# Patient Record
Sex: Male | Born: 1965 | Race: White | Hispanic: No | Marital: Married | Smoking: Never smoker
Health system: Southern US, Community
[De-identification: ages and names within clinical notes are randomized; demographics above are authoritative.]

## PROBLEM LIST (undated history)

## (undated) DIAGNOSIS — R412 Retrograde amnesia: Secondary | ICD-10-CM

## (undated) HISTORY — PX: KNEE ARTHROSCOPY: SHX127

## (undated) HISTORY — PX: ANKLE ARTHROSCOPY: SHX545

## (undated) HISTORY — PX: TESTICLE SURGERY: SHX794

---

## 1999-03-03 ENCOUNTER — Emergency Department (HOSPITAL_COMMUNITY): Admission: EM | Admit: 1999-03-03 | Discharge: 1999-03-04 | Payer: Self-pay

## 2010-11-01 ENCOUNTER — Emergency Department (HOSPITAL_BASED_OUTPATIENT_CLINIC_OR_DEPARTMENT_OTHER)
Admission: EM | Admit: 2010-11-01 | Discharge: 2010-11-01 | Disposition: A | Discharge status: Home / Self Care | Payer: Self-pay | Attending: Emergency Medicine | Admitting: Emergency Medicine

## 2010-11-01 ENCOUNTER — Emergency Department (INDEPENDENT_AMBULATORY_CARE_PROVIDER_SITE_OTHER): Payer: Self-pay

## 2010-11-01 DIAGNOSIS — R42 Dizziness and giddiness: Secondary | ICD-10-CM | POA: Insufficient documentation

## 2010-11-01 DIAGNOSIS — H538 Other visual disturbances: Secondary | ICD-10-CM

## 2010-11-01 LAB — CBC
HCT: 42.3 % (ref 39.0–52.0)
Hemoglobin: 15.3 g/dL (ref 13.0–17.0)
MCH: 31.2 pg (ref 26.0–34.0)
MCHC: 36.2 g/dL — ABNORMAL HIGH (ref 30.0–36.0)
MCV: 86.2 fL (ref 78.0–100.0)
Platelets: 208 10*3/uL (ref 150–400)
RBC: 4.91 MIL/uL (ref 4.22–5.81)
RDW: 12.7 % (ref 11.5–15.5)
WBC: 7.6 10*3/uL (ref 4.0–10.5)

## 2010-11-01 LAB — URINALYSIS, ROUTINE W REFLEX MICROSCOPIC
Glucose, UA: NEGATIVE mg/dL
Ketones, ur: NEGATIVE mg/dL
Nitrite: NEGATIVE
Specific Gravity, Urine: 1.021 (ref 1.005–1.030)
pH: 6 (ref 5.0–8.0)

## 2010-11-01 LAB — COMPREHENSIVE METABOLIC PANEL
ALT: 38 U/L (ref 0–53)
AST: 29 U/L (ref 0–37)
Albumin: 3.9 g/dL (ref 3.5–5.2)
Alkaline Phosphatase: 86 U/L (ref 39–117)
BUN: 18 mg/dL (ref 6–23)
CO2: 23 mEq/L (ref 19–32)
Calcium: 8.8 mg/dL (ref 8.4–10.5)
Chloride: 106 mEq/L (ref 96–112)
Creatinine, Ser: 1.1 mg/dL (ref 0.4–1.5)
GFR calc Af Amer: >60 mL/min (ref >60)
GFR calc non Af Amer: >60 mL/min (ref >60)
Glucose, Bld: 135 mg/dL — ABNORMAL HIGH (ref 70–99)
Potassium: 3.9 mEq/L (ref 3.5–5.1)
Sodium: 141 mEq/L (ref 135–145)
Total Bilirubin: 0.6 mg/dL (ref 0.3–1.2)
Total Protein: 7.4 g/dL (ref 6.0–8.3)

## 2010-11-01 LAB — POCT CARDIAC MARKERS
CKMB, poc: <1 ng/mL — ABNORMAL LOW (ref 1.0–8.0)
CKMB, poc: <1 ng/mL — ABNORMAL LOW (ref 1.0–8.0)
Myoglobin, poc: 44.4 ng/mL (ref 12–200)
Troponin i, poc: <0.05 ng/mL (ref 0.00–0.09)
Troponin i, poc: <0.05 ng/mL (ref 0.00–0.09)

## 2010-11-01 LAB — DIFFERENTIAL
Basophils Absolute: 0 10*3/uL (ref 0.0–0.1)
Basophils Relative: 0 % (ref 0–1)
Eosinophils Absolute: 0.1 10*3/uL (ref 0.0–0.7)
Eosinophils Relative: 2 % (ref 0–5)
Lymphocytes Relative: 26 % (ref 12–46)
Lymphs Abs: 2 10*3/uL (ref 0.7–4.0)
Monocytes Absolute: 0.8 10*3/uL (ref 0.1–1.0)
Monocytes Relative: 10 % (ref 3–12)
Neutro Abs: 4.7 10*3/uL (ref 1.7–7.7)
Neutrophils Relative %: 62 % (ref 43–77)

## 2011-01-26 ENCOUNTER — Emergency Department (HOSPITAL_BASED_OUTPATIENT_CLINIC_OR_DEPARTMENT_OTHER)
Admission: EM | Admit: 2011-01-26 | Discharge: 2011-01-26 | Disposition: A | Discharge status: Home / Self Care | Payer: Self-pay | Attending: Emergency Medicine | Admitting: Emergency Medicine

## 2011-01-26 DIAGNOSIS — R109 Unspecified abdominal pain: Secondary | ICD-10-CM | POA: Insufficient documentation

## 2011-01-26 DIAGNOSIS — N200 Calculus of kidney: Secondary | ICD-10-CM | POA: Insufficient documentation

## 2011-01-26 LAB — URINE MICROSCOPIC-ADD ON

## 2011-01-26 LAB — URINALYSIS, ROUTINE W REFLEX MICROSCOPIC
Bilirubin Urine: NEGATIVE
Ketones, ur: 15 mg/dL — AB
Nitrite: NEGATIVE
Specific Gravity, Urine: 1.031 — ABNORMAL HIGH (ref 1.005–1.030)
Urobilinogen, UA: 0.2 mg/dL (ref 0.0–1.0)
pH: 5.5 (ref 5.0–8.0)

## 2011-01-26 LAB — BASIC METABOLIC PANEL
Calcium: 9.6 mg/dL (ref 8.4–10.5)
Creatinine, Ser: 1.4 mg/dL — ABNORMAL HIGH (ref 0.50–1.35)
GFR calc Af Amer: >60 mL/min (ref >60)
Sodium: 140 mEq/L (ref 135–145)

## 2012-06-19 IMAGING — CT CT HEAD W/O CM
1 series · 16 of 30 positions shown, 20 images · non-contrast
Comparison: None.

CLINICAL DATA: Dizzy with blurred vision.

CT HEAD WITHOUT CONTRAST
TECHNIQUE: Contiguous axial images were obtained from the base of
the skull through the vertex without contrast.

[Series 2: head 4.8 h37s · axial · 0.45mm/px · z∈[-150,+10]mm · 16 of 36 slices shown, 20 images]
[im 2/36  brain]
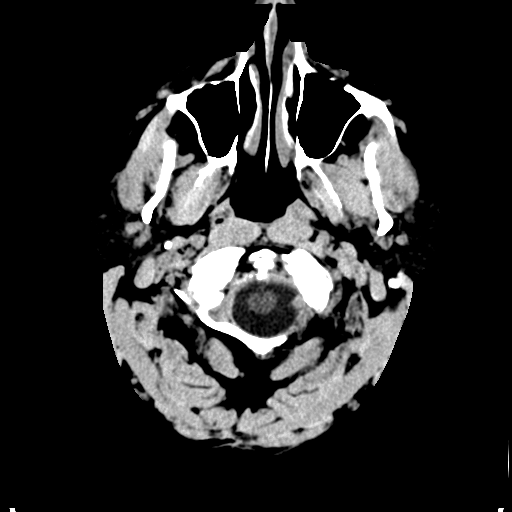
[im 2/36  bone]
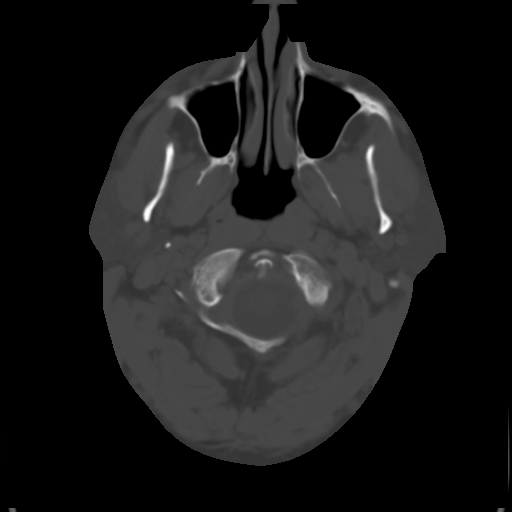
[im 4/36  brain]
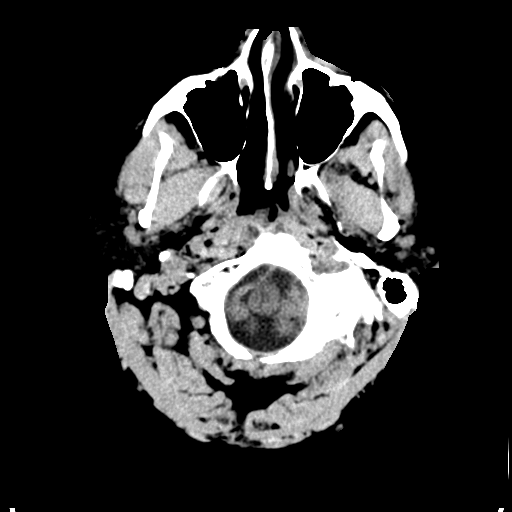
[im 7/36  brain]
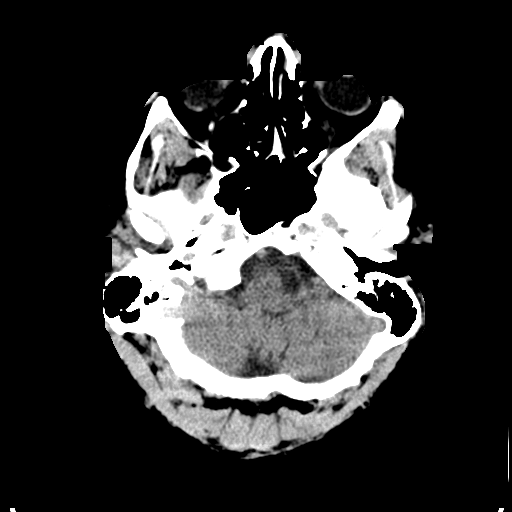
[im 9/36  brain]
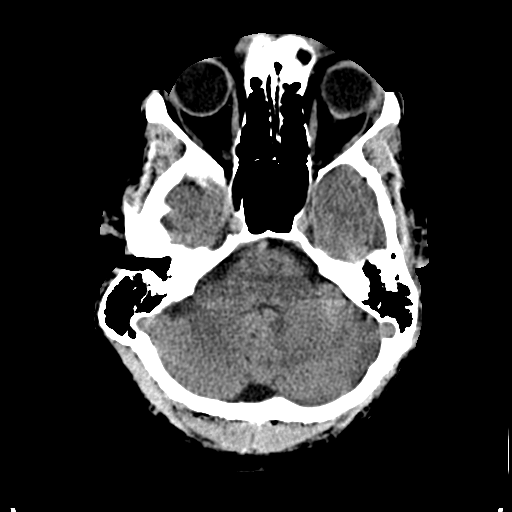
[im 10/36  brain]
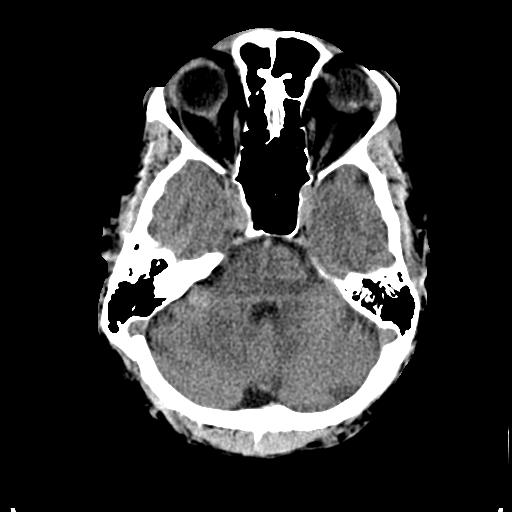
[im 10/36  bone]
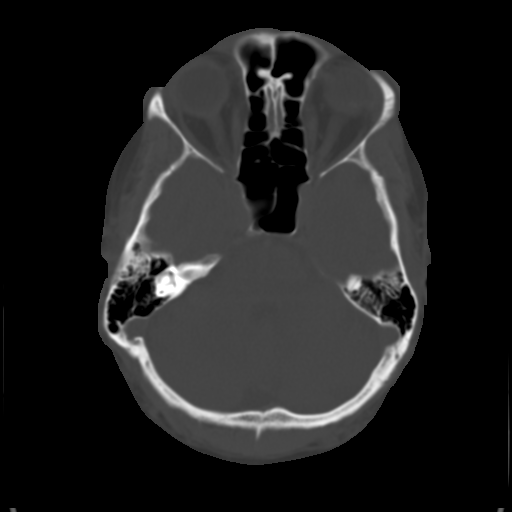
[im 13/36  brain]
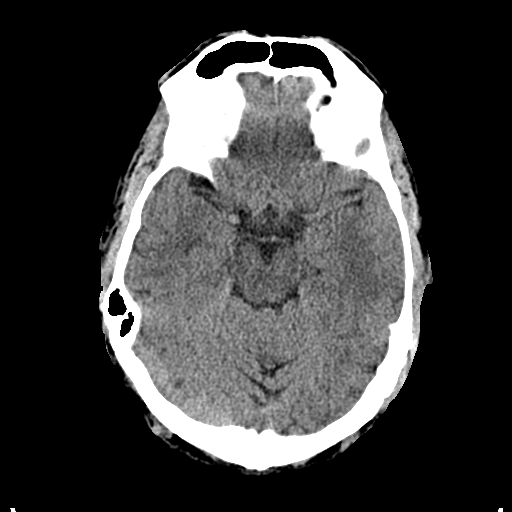
[im 15/36  brain]
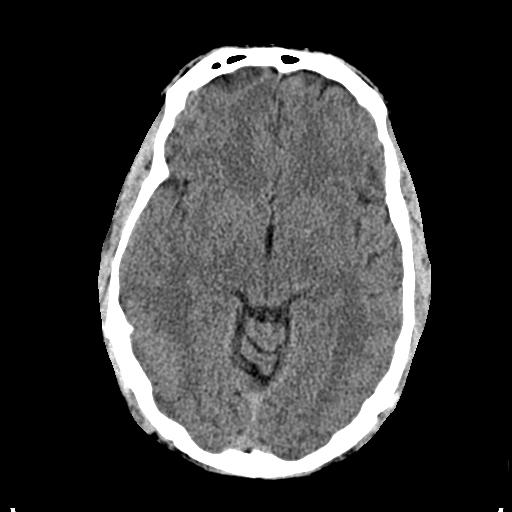
[im 17/36  brain]
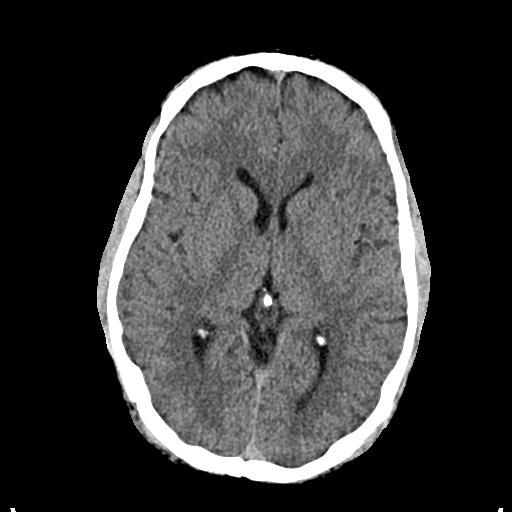
[im 19/36  brain]
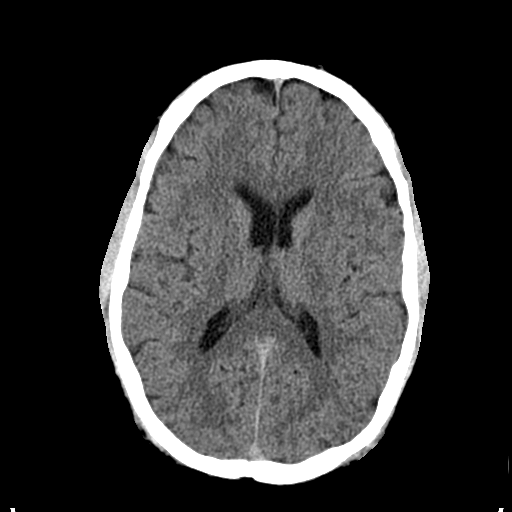
[im 19/36  bone]
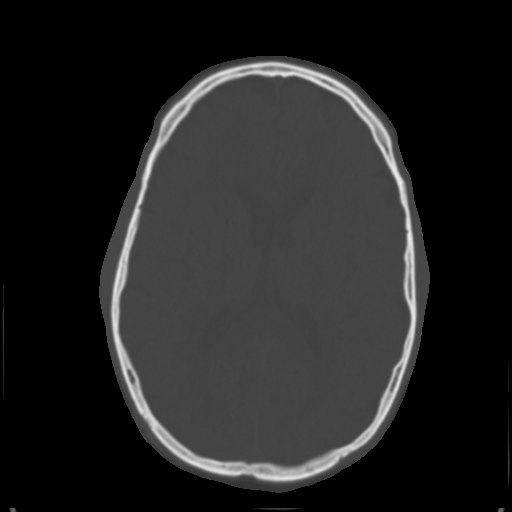
[im 21/36  brain]
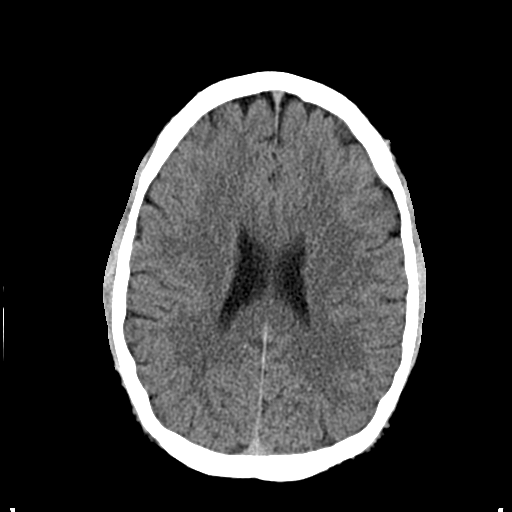
[im 23/36  brain]
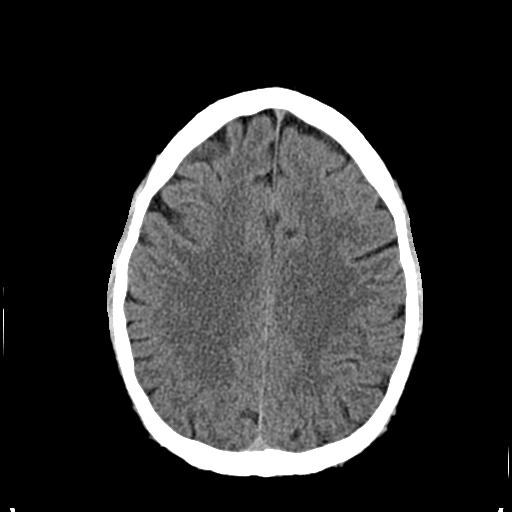
[im 26/36  brain]
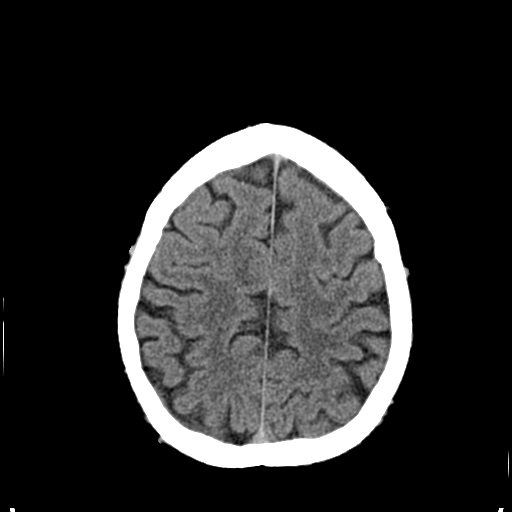
[im 27/36  brain]
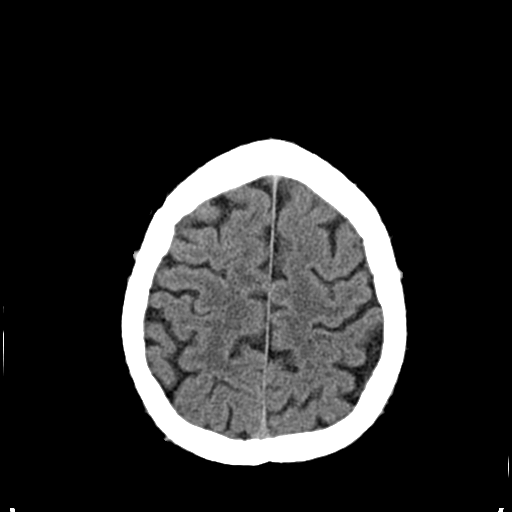
[im 27/36  bone]
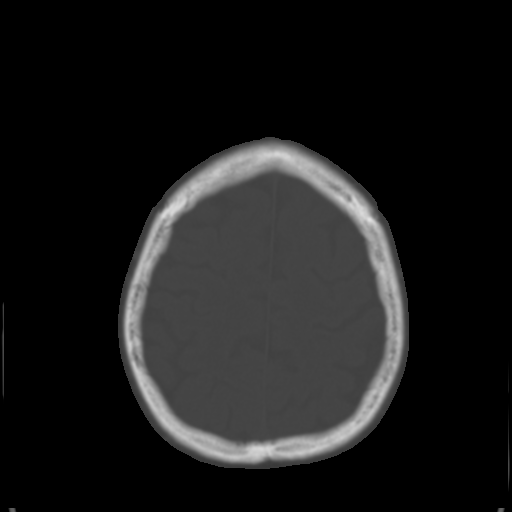
[im 29/36  brain]
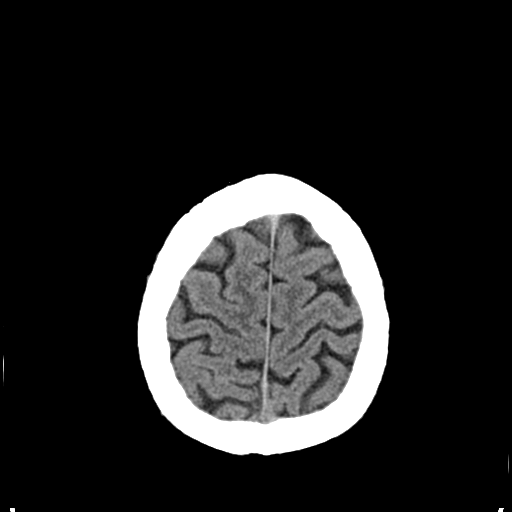
[im 32/36  brain]
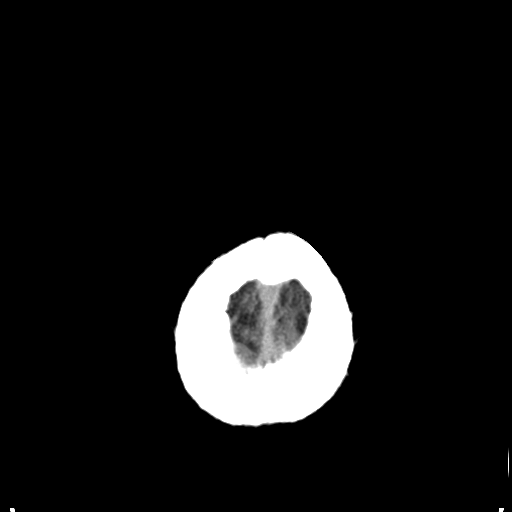
[im 34/36  brain]
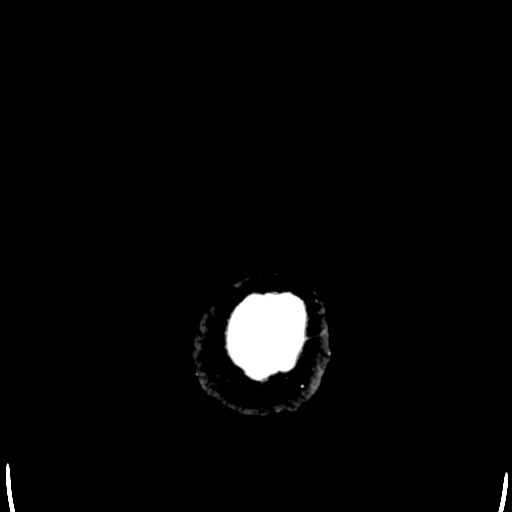

[16 of 30 positions shown; findings below may reference images not displayed]

FINDINGS: There is no evidence for acute infarction, intracranial
hemorrhage, mass lesion, hydrocephalus, or extra-axial fluid. There
is no atrophy or white matter disease.  The calvarium is intact.
There is no acute sinus or mastoid disease. No visible orbital
findings.
IMPRESSION: Negative exam.

## 2015-06-09 ENCOUNTER — Inpatient Hospital Stay (HOSPITAL_COMMUNITY)
Admission: EM | Admit: 2015-06-09 | Discharge: 2015-06-10 | DRG: 312 | Disposition: A | Discharge status: Home / Self Care | Payer: Self-pay | Attending: Family Medicine | Admitting: Family Medicine

## 2015-06-09 ENCOUNTER — Encounter (HOSPITAL_COMMUNITY): Payer: Self-pay | Admitting: Emergency Medicine

## 2015-06-09 ENCOUNTER — Emergency Department (HOSPITAL_COMMUNITY): Payer: Self-pay

## 2015-06-09 ENCOUNTER — Emergency Department (HOSPITAL_COMMUNITY): Payer: MEDICAID

## 2015-06-09 DIAGNOSIS — Z8249 Family history of ischemic heart disease and other diseases of the circulatory system: Secondary | ICD-10-CM

## 2015-06-09 DIAGNOSIS — Y92000 Kitchen of unspecified non-institutional (private) residence as  the place of occurrence of the external cause: Secondary | ICD-10-CM

## 2015-06-09 DIAGNOSIS — R4020 Unspecified coma: Secondary | ICD-10-CM | POA: Diagnosis present

## 2015-06-09 DIAGNOSIS — R251 Tremor, unspecified: Secondary | ICD-10-CM | POA: Diagnosis present

## 2015-06-09 DIAGNOSIS — M25511 Pain in right shoulder: Secondary | ICD-10-CM | POA: Diagnosis present

## 2015-06-09 DIAGNOSIS — E860 Dehydration: Secondary | ICD-10-CM | POA: Diagnosis present

## 2015-06-09 DIAGNOSIS — R412 Retrograde amnesia: Secondary | ICD-10-CM | POA: Diagnosis present

## 2015-06-09 DIAGNOSIS — R531 Weakness: Secondary | ICD-10-CM | POA: Diagnosis present

## 2015-06-09 DIAGNOSIS — R4781 Slurred speech: Secondary | ICD-10-CM

## 2015-06-09 DIAGNOSIS — R55 Syncope and collapse: Principal | ICD-10-CM | POA: Diagnosis present

## 2015-06-09 DIAGNOSIS — W19XXXA Unspecified fall, initial encounter: Secondary | ICD-10-CM | POA: Diagnosis present

## 2015-06-09 DIAGNOSIS — R471 Dysarthria and anarthria: Secondary | ICD-10-CM | POA: Diagnosis present

## 2015-06-09 HISTORY — DX: Retrograde amnesia: R41.2

## 2015-06-09 LAB — CBC
HCT: 45 % (ref 39.0–52.0)
Hemoglobin: 15.9 g/dL (ref 13.0–17.0)
MCH: 31.9 pg (ref 26.0–34.0)
MCHC: 35.3 g/dL (ref 30.0–36.0)
MCV: 90.4 fL (ref 78.0–100.0)
PLATELETS: 183 10*3/uL (ref 150–400)
RBC: 4.98 MIL/uL (ref 4.22–5.81)
RDW: 13.2 % (ref 11.5–15.5)
WBC: 7.4 10*3/uL (ref 4.0–10.5)

## 2015-06-09 LAB — COMPREHENSIVE METABOLIC PANEL
ALBUMIN: 3.8 g/dL (ref 3.5–5.0)
ALT: 35 U/L (ref 17–63)
AST: 33 U/L (ref 15–41)
Alkaline Phosphatase: 63 U/L (ref 38–126)
Anion gap: 10 (ref 5–15)
BUN: 18 mg/dL (ref 6–20)
CALCIUM: 9.6 mg/dL (ref 8.9–10.3)
CHLORIDE: 103 mmol/L (ref 101–111)
CO2: 26 mmol/L (ref 22–32)
Creatinine, Ser: 1.51 mg/dL — ABNORMAL HIGH (ref 0.61–1.24)
GFR calc non Af Amer: 52 mL/min — ABNORMAL LOW (ref >60)
Glucose, Bld: 114 mg/dL — ABNORMAL HIGH (ref 65–99)
Potassium: 4.7 mmol/L (ref 3.5–5.1)
SODIUM: 139 mmol/L (ref 135–145)
TOTAL PROTEIN: 7 g/dL (ref 6.5–8.1)
Total Bilirubin: 0.8 mg/dL (ref 0.3–1.2)

## 2015-06-09 LAB — I-STAT TROPONIN, ED: Troponin i, poc: 0.01 ng/mL (ref 0.00–0.08)

## 2015-06-09 LAB — DIFFERENTIAL
BASOS ABS: 0 10*3/uL (ref 0.0–0.1)
BASOS PCT: 0 %
Eosinophils Absolute: 0 10*3/uL (ref 0.0–0.7)
Eosinophils Relative: 1 %
LYMPHS PCT: 15 %
Lymphs Abs: 1.1 10*3/uL (ref 0.7–4.0)
Monocytes Absolute: 0.8 10*3/uL (ref 0.1–1.0)
Monocytes Relative: 11 %
NEUTROS ABS: 5.5 10*3/uL (ref 1.7–7.7)
NEUTROS PCT: 73 %

## 2015-06-09 LAB — PROTIME-INR
INR: 1.03 (ref 0.00–1.49)
INR: 1.08 (ref 0.00–1.49)
PROTHROMBIN TIME: 13.7 s (ref 11.6–15.2)
Prothrombin Time: 14.2 seconds (ref 11.6–15.2)

## 2015-06-09 LAB — I-STAT CHEM 8, ED
BUN: 20 mg/dL (ref 6–20)
CALCIUM ION: 1.17 mmol/L (ref 1.12–1.23)
Chloride: 102 mmol/L (ref 101–111)
Creatinine, Ser: 1.5 mg/dL — ABNORMAL HIGH (ref 0.61–1.24)
GLUCOSE: 112 mg/dL — AB (ref 65–99)
HCT: 49 % (ref 39.0–52.0)
HEMOGLOBIN: 16.7 g/dL (ref 13.0–17.0)
Potassium: 4.5 mmol/L (ref 3.5–5.1)
Sodium: 139 mmol/L (ref 135–145)
TCO2: 24 mmol/L (ref 0–100)

## 2015-06-09 LAB — LIPID PANEL
CHOL/HDL RATIO: 3.1 ratio
Cholesterol: 159 mg/dL (ref 0–200)
HDL: 52 mg/dL (ref >40)
LDL CALC: 100 mg/dL — AB (ref 0–99)
TRIGLYCERIDES: 37 mg/dL (ref <150)
VLDL: 7 mg/dL (ref 0–40)

## 2015-06-09 LAB — CBG MONITORING, ED: GLUCOSE-CAPILLARY: 93 mg/dL (ref 65–99)

## 2015-06-09 LAB — RAPID URINE DRUG SCREEN, HOSP PERFORMED
Amphetamines: NOT DETECTED
BARBITURATES: NOT DETECTED
Benzodiazepines: NOT DETECTED
Cocaine: NOT DETECTED
Opiates: NOT DETECTED
TETRAHYDROCANNABINOL: NOT DETECTED

## 2015-06-09 LAB — TSH: TSH: 1.588 u[IU]/mL (ref 0.350–4.500)

## 2015-06-09 LAB — CK: Total CK: 176 U/L (ref 49–397)

## 2015-06-09 LAB — MRSA PCR SCREENING: MRSA BY PCR: NEGATIVE

## 2015-06-09 LAB — TROPONIN I: Troponin I: <0.03 ng/mL (ref <0.031)

## 2015-06-09 LAB — APTT: APTT: 25 s (ref 24–37)

## 2015-06-09 MED ORDER — SODIUM CHLORIDE 0.9 % IJ SOLN
3.0000 mL | Freq: Two times a day (BID) | INTRAMUSCULAR | Status: DC
  Administered 2015-06-10: 3 mL via INTRAVENOUS

## 2015-06-09 MED ORDER — SODIUM CHLORIDE 0.9 % IV BOLUS (SEPSIS)
1000.0000 mL | Freq: Once | INTRAVENOUS | Status: AC
Start: 2015-06-09 — End: 2015-06-09
  Administered 2015-06-09: 1000 mL via INTRAVENOUS

## 2015-06-09 MED ORDER — ACETAMINOPHEN 325 MG PO TABS: 650.0000 mg | ORAL_TABLET | Freq: Four times a day (QID) | ORAL | Status: DC | PRN

## 2015-06-09 MED ORDER — ASPIRIN EC 325 MG PO TBEC: 325.0000 mg | DELAYED_RELEASE_TABLET | Freq: Every day | ORAL | Status: DC

## 2015-06-09 MED ORDER — SODIUM CHLORIDE 0.9 % IV SOLN
INTRAVENOUS | Status: DC
  Administered 2015-06-09: 19:00:00 via INTRAVENOUS

## 2015-06-09 MED ORDER — ENOXAPARIN SODIUM 40 MG/0.4ML ~~LOC~~ SOLN
40.0000 mg | SUBCUTANEOUS | Status: DC
  Administered 2015-06-09: 40 mg via SUBCUTANEOUS
  Filled 2015-06-09 (×2): qty 0.4

## 2015-06-09 MED ORDER — IOHEXOL 350 MG/ML SOLN
130.0000 mL | Freq: Once | INTRAVENOUS | Status: AC | PRN
  Administered 2015-06-09: 130 mL via INTRAVENOUS

## 2015-06-09 NOTE — H&P (Signed)
Family Medicine Teaching [facility] Admission History and Physical Service Pager: [phone]  Patient name: Scott Conway Medical record number: [id number] Date of birth: 1965-12-21 Age: 49 y.o. Gender: male  Primary Care Provider: No primary care provider on file. Consultants: neuorology Code Status: full  Chief Complaint: Fall and Loss of Consciousness  Assessment and Plan: Scott Conway is a 49 y.o. male presenting with presenting with fall, LOC and seizure like activity. He has no PMH  Fall/LOC/Shaking: likely seizure activity. He appears exhausted and fatigued which is suggestive for post-ictal state. However, there is no sign of tongue bite or incontinence. Unlikely to be syncopal episode with prolonged loss of consciousness and post-syncopal signs. Unlikely to be CVA given no focal neurologic finding and negative CT and CTA of his head and neck. He has some T wave changes on EKG but POCT troponin is negative. Follow up troponin also negative. He denies chest pain or shortness of breath as well. HEART score of 2. Mother died of sudden heart attack without prior diagnosis although at age 61 . Point of care glucose 93 which makes hypoglycemia unlikely. CMP within normal limit to suspect electrolyte imbalance. Infectious etiologies unlikely without fever and leukocytosis. UDS negative. - Admit to step down with telemetry. Attending Dr. Perley Jain - Neuro consulted in ED. Appreciate recs. Follow up further recs - CK - EEG - MRI brain with and without contrast - Repeat EKG. Initial EKG with some TW changes. - Trend troponin. POCT troponin negative - Echo - Risk stratification labs for CVA although this is unlikely at this time(A1c, Lipid panel, TSH) - Consider antiplatelet if stroke likely but I am less suspicious at this point.  Elevated Cr to 1.51. Similar to prior of 1.40 in 2012. No history of CKD. This could be from muscular mass.  - Follow BMP - NS @ 159ml/hr.   Right shoulder  pain: chronic. X-ray negative for fracture or dislocation but significant for mild degenerative changes -Tylenol as needed pain  FEN/GI: -IVF as above -regular diet (passed SLP in ED)  Prophylaxis: Lovenex  Disposition: step down for q2h neuro check. Anticipate transfer to telemetry tomorrow.  History of Present Illness:  Scott Conway is a 49 y.o. male presenting with fall and LOC at home.  Patient recalls getting in the car to come home from gym this morning about 11 am. Fiance, who is an EMT reports that their son saw him going to a kitchen table and then falling on the floor and getting up.  Son thought he was joking around. Then patient headed to bathroom. Fiance heard something and thought it was her son; son later came and told her patient was laying in the bathroom unconscious, with clothes on. She shook to arouse him and he then began staring though he was not verbal. He was able to nod and shake his head to communicate. He was also drooling, arms and body shaking "like a seizure". He could hold up arms for short time but was shaking really bad. His fiance also noted rapid pulse although heart rate not measured.There was no incontinence noted. He remembers waking up in ambulance. He reports drinking a lot of water at the gym today.   Patient denies local weakness, vision changes, headache but feels weak all over. States he does note a pressure in his head when seated. He feels pain all over like he had been in car wreck. He reports his speech is back to normal but fiance states still slower than normal.  He has right shoulder pain (chronic) and took ibuprofen yesterday. He denies fever, dizziness, nausea, vomiting, chest pain, shortness of breath or illness leading to this. He never had an event like this before.  Family history significant for HTN (dad), mother with sudden cardiac death at ~55 years of age, likely from CHF.  No history of stroke or seizure. He has two siblings, who are  pretty healthy. He takes pre-workout energy powder that he get from [facility]. Denies other supplements or recreational drug use. Denies smoking. He drinks EtOH occasionally.  In ED: CT head and neck and CTA head and neck are negative. EKG with some TW changes in inferior and lateral. CMP, CBC with diff and UDS negative. Seen by neurologist who recommended admission for further evaluation. Received 1L of NS.  Review Of Systems: Per HPI  Otherwise the remainder of the systems were negative.  Patient Active Problem List   Diagnosis Date Noted  . Syncope 06/09/2015    Past Medical History: History reviewed. No pertinent past medical history.  Past Surgical History: History reviewed. No pertinent past surgical history.  Social History: Social History  Substance Use Topics  . Smoking status: Never Smoker   . Smokeless tobacco: None  . Alcohol Use: Yes   Additional social history: Please also refer to relevant sections of EMR.  Family History: HTN (dad), mother with sudden cardiac death at ~26 years of age, likley from CHF.  No history of stroke or seizure. He has two siblings, who are pretty healthy.   Allergies and Medications: No Known Allergies No current facility-administered medications on file prior to encounter.   No current outpatient prescriptions on file prior to encounter.    Objective: BP 122/82 mmHg  Pulse 70  Temp(Src) 98.2 F (36.8 C) (Oral)  Resp 21  Ht  (1.803 m)  Wt 270 lb (122.471 kg)  BMI 37.67 kg/m2  SpO2 96% Exam: Gen: tired-appearing, 49yo male in no apparent distress Eyes: PERRLA, sclera anicteric Nares: clear, no erythema, swelling or congestion Oropharynx: clear, moist CV: RRR. S1 & S2 audible, no murmurs. Resp: no apparent WOB, CTAB. Abd: +BS. Soft, NDNT, no rebound or guarding.  Ext: No edema or gross deformities. Skin: abrasions noted on legs, no signs of head trauma Neuro: MS - Awake, alert, interactive.  Speech is fluent, with no  aphasia. Attention is appropriate. No confusion. Appropriate behavior and follow commands.  Cranial Nerves - Pupils were equal and reactive (5 to 2mm); EOM normal, no nystagmus; no ptosis,  intact facial sensation, face symmetric with full strength of facial muscles, palate elevation is symmetric, tongue protrusion is symmetric with full movement to both side. Sternocleidomastoid and trapezius are with normal strength.  Tone - Normal.  Strength - normal in all muscle group Plantar reflex: down going Sensation: Intact to light touch.  No pronator drift. Coordination : No dysmetria on finger to nose.   Labs and Imaging: - CK 176 - Troponin negative - TSH 1.588 - UDS negative - Lipid Panel: Cholesterol 159, TG 37, HDL 52, LDL 100 CBC BMET   Recent Labs Lab 06/09/15 1130 06/09/15 1141  WBC 7.4  --   HGB 15.9 16.7  HCT 45.0 49.0  PLT 183  --     Recent Labs Lab 06/09/15 1130 06/09/15 1141  NA 139 139  K 4.7 4.5  CL 103 102  CO2 26  --   BUN 18 20  CREATININE 1.51* 1.50*  GLUCOSE 114* 112*  CALCIUM 9.6  --  Ct Angio Head W/cm &/or Wo Cm  06/09/2015  CLINICAL DATA:  Altered mental status. Stroke-like symptoms. Uncontrolled tremor of all extremities. Difficulty speaking. Patient fell or had syncopal episode. EXAM: CT ANGIOGRAPHY HEAD AND NECK TECHNIQUE: Multidetector CT imaging of the head and neck was performed using the standard protocol during bolus administration of intravenous contrast. Multiplanar CT image reconstructions and MIPs were obtained to evaluate the vascular anatomy. Carotid stenosis measurements (when applicable) are obtained utilizing NASCET criteria, using the distal internal carotid diameter as the denominator. CONTRAST:  130mL OMNIPAQUE IOHEXOL 350 MG/ML SOLN COMPARISON:  CT head at 11:18 earlier today. FINDINGS: CT HEAD Calvarium and skull base: No fracture or destructive lesion. Mastoids and middle ears are grossly clear. Paranasal sinuses: Imaged  portions are clear. Orbits: Negative. Brain: No evidence of acute abnormality, including acute infarct, hemorrhage, hydrocephalus, or mass lesion. CTA NECK Aortic arch: Standard branching. Imaged portion shows no evidence of aneurysm or dissection. No significant stenosis of the major arch vessel origins. Right carotid system: No evidence of dissection, stenosis (50% or greater) or occlusion. Left carotid system: No evidence of dissection, stenosis (50% or greater) or occlusion. Vertebral arteries: Both are patent, with LEFT dominant. Both contribute to basilar formation. No evidence of dissection, stenosis (50% or greater) or occlusion. Nonvascular soft tissues: Lung apices grossly clear. No osseous findings other than mild spondylosis. No neck masses. Airway midline. CTA HEAD Anterior circulation: No significant stenosis, proximal occlusion, aneurysm, or vascular malformation. Posterior circulation: No significant stenosis, proximal occlusion, aneurysm, or vascular malformation. Venous sinuses: As permitted by contrast timing, patent. Anatomic variants: None of significance. Delayed phase:   No abnormal intracranial enhancement. IMPRESSION: Negative CTA head and neck. No extracranial or intracranial vascular stenosis or occlusion of significance. No abnormal postcontrast enhancement is evident. Critical Value/emergent results were called by telephone at the time of interpretation on 06/09/2015 at 12:15 pm to Dr. Thad Rangereynolds, who verbally acknowledged these results. Electronically Signed   By: Elsie StainJohn T Curnes M.D.   On: 06/09/2015 12:51   Dg Shoulder Right  06/09/2015  CLINICAL DATA:  Fall, right shoulder pain EXAM: RIGHT SHOULDER - 2+ VIEW COMPARISON:  None. FINDINGS: No fracture or dislocation is seen. Mild degenerative changes of the glenohumeral joint. The visualized soft tissues are unremarkable. Visualized right lung is clear. IMPRESSION: No fracture or dislocation is seen. Mild degenerative changes.  Electronically Signed   By: Charline BillsSriyesh  Krishnan M.D.   On: 06/09/2015 14:48   Ct Head Wo Contrast  06/09/2015  CLINICAL DATA:  Last seen well 0830am, Pt came home from the gym and sat down at the kitchen table and then fell out into the floor. Got up to go to the bathroom and fell again. Unable to get his words out. Uncontrolled tremor of all extremities EXAM: CT HEAD WITHOUT CONTRAST CT CERVICAL SPINE WITHOUT CONTRAST TECHNIQUE: Multidetector CT imaging of the head and cervical spine was performed following the standard protocol without intravenous contrast. Multiplanar CT image reconstructions of the cervical spine were also generated. COMPARISON:  Head CT 11/01/2010 FINDINGS: CT HEAD FINDINGS Patient is scanned in nonstandard head position which generates streak artifact through the posterior fossa from the dental amalgam. No acute intracranial hemorrhage. No focal mass lesion. No CT evidence of acute infarction. No midline shift or mass effect. 2 punctate calcifications within the LEFT internal capsule (image 18, series 20 1) not changed from prior. No hydrocephalus. Basilar cisterns are patent. CT CERVICAL SPINE FINDINGS No prevertebral soft tissue swelling. Normal alignment of  cervical vertebral bodies. No loss of vertebral body height. Normal facet articulation. Normal craniocervical junction.No evidence epidural or paraspinal hematoma. IMPRESSION: 1. No acute intracranial findings. No CT evidence acute infarction. No evidence of intracranial hemorrhage. 2. No cervical spine fracture. Electronically Signed   By: Genevive Bi M.D.   On: 06/09/2015 11:47   Ct Angio Neck W/cm &/or Wo/cm  06/09/2015  CLINICAL DATA:  Altered mental status. Stroke-like symptoms. Uncontrolled tremor of all extremities. Difficulty speaking. Patient fell or had syncopal episode. EXAM: CT ANGIOGRAPHY HEAD AND NECK TECHNIQUE: Multidetector CT imaging of the head and neck was performed using the standard protocol during bolus  administration of intravenous contrast. Multiplanar CT image reconstructions and MIPs were obtained to evaluate the vascular anatomy. Carotid stenosis measurements (when applicable) are obtained utilizing NASCET criteria, using the distal internal carotid diameter as the denominator. CONTRAST:  OMNIPAQUE IOHEXOL 350 MG/ML SOLN COMPARISON:  CT head at 11:18 earlier today. FINDINGS: CT HEAD Calvarium and skull base: No fracture or destructive lesion. Mastoids and middle ears are grossly clear. Paranasal sinuses: Imaged portions are clear. Orbits: Negative. Brain: No evidence of acute abnormality, including acute infarct, hemorrhage, hydrocephalus, or mass lesion. CTA NECK Aortic arch: Standard branching. Imaged portion shows no evidence of aneurysm or dissection. No significant stenosis of the major arch vessel origins. Right carotid system: No evidence of dissection, stenosis (50% or greater) or occlusion. Left carotid system: No evidence of dissection, stenosis (50% or greater) or occlusion. Vertebral arteries: Both are patent, with LEFT dominant. Both contribute to basilar formation. No evidence of dissection, stenosis (50% or greater) or occlusion. Nonvascular soft tissues: Lung apices grossly clear. No osseous findings other than mild spondylosis. No neck masses. Airway midline. CTA HEAD Anterior circulation: No significant stenosis, proximal occlusion, aneurysm, or vascular malformation. Posterior circulation: No significant stenosis, proximal occlusion, aneurysm, or vascular malformation. Venous sinuses: As permitted by contrast timing, patent. Anatomic variants: None of significance. Delayed phase:   No abnormal intracranial enhancement. IMPRESSION: Negative CTA head and neck. No extracranial or intracranial vascular stenosis or occlusion of significance. No abnormal postcontrast enhancement is evident. Critical Value/emergent results were called by telephone at the time of interpretation on 06/09/2015  at 12:15 pm to Dr. Thad Ranger, who verbally acknowledged these results. Electronically Signed   By: Elsie Stain M.D.   On: 06/09/2015 12:51   Ct Cervical Spine Wo Contrast  06/09/2015  CLINICAL DATA:  Last seen well 0830am, Pt came home from the gym and sat down at the kitchen table and then fell out into the floor. Got up to go to the bathroom and fell again. Unable to get his words out. Uncontrolled tremor of all extremities EXAM: CT HEAD WITHOUT CONTRAST CT CERVICAL SPINE WITHOUT CONTRAST TECHNIQUE: Multidetector CT imaging of the head and cervical spine was performed following the standard protocol without intravenous contrast. Multiplanar CT image reconstructions of the cervical spine were also generated. COMPARISON:  Head CT 11/01/2010 FINDINGS: CT HEAD FINDINGS Patient is scanned in nonstandard head position which generates streak artifact through the posterior fossa from the dental amalgam. No acute intracranial hemorrhage. No focal mass lesion. No CT evidence of acute infarction. No midline shift or mass effect. 2 punctate calcifications within the LEFT internal capsule (image 18, series 20 1) not changed from prior. No hydrocephalus. Basilar cisterns are patent. CT CERVICAL SPINE FINDINGS No prevertebral soft tissue swelling. Normal alignment of cervical vertebral bodies. No loss of vertebral body height. Normal facet articulation. Normal craniocervical junction.No evidence  epidural or paraspinal hematoma. IMPRESSION: 1. No acute intracranial findings. No CT evidence acute infarction. No evidence of intracranial hemorrhage. 2. No cervical spine fracture. Electronically Signed   By: Genevive Bi M.D.   On: 06/09/2015 11:47   Ct Angio Chest Aorta W/cm &/or Wo/cm  06/09/2015  CLINICAL DATA:  Pt had syncopal episode twice today. R/o thoracic dissection No PMH Returned home from the gym and had two falls EXAM: CT ANGIOGRAPHY CHEST WITH CONTRAST TECHNIQUE: Multidetector CT imaging of the chest was  performed using the standard protocol during bolus administration of intravenous contrast. Multiplanar CT image reconstructions and MIPs were obtained to evaluate the vascular anatomy. CONTRAST:  OMNIPAQUE IOHEXOL 350 MG/ML SOLN FINDINGS: The non contrast scan shows no mediastinal hematoma, thoracic aneurysm, or hyperdense crescent. CTA via left arm IV contrast injection. Innominate vein and SVC patent. Incomplete opacification of pulmonary artery branches; the exam was not optimized for detection of pulmonary emboli. Scattered coronary calcifications. Adequate contrast opacification of the thoracic aorta with no evidence of dissection, aneurysm, or stenosis. There is classic 3-vessel brachiocephalic arch anatomy without proximal stenosis. No significant atheromatous plaque. No hilar or mediastinal adenopathy. No pleural or pericardial effusion. Mild dependent atelectasis posteriorly in both lower lobes. Continued breathing motion degrades images through the lower lobes. Lungs otherwise clear. Mild spondylitic changes in the lower lumbar spine. Sternum intact. Visualized portions of upper abdomen unremarkable. Review of the MIP images confirms the above findings. IMPRESSION: 1. Negative for thoracic aortic dissection or other acute vascular finding. 2. Atherosclerosis, including coronary artery disease. Please note that although the presence of coronary artery calcium documents the presence of coronary artery disease, the severity of this disease and any potential stenosis cannot be assessed on this non-gated CT examination. Assessment for potential risk factor modification, dietary therapy or pharmacologic therapy may be warranted, if clinically indicated. Electronically Signed   By: Corlis Leak M.D.   On: 06/09/2015 12:40   Almon Hercules, MD 06/09/2015, 2:35 PM PGY-1 [facility] FPTS Intern pager: [phone], text pages welcome  Upper Level Addendum:  I have seen and evaluated this  patient along with Dr. Alanda Slim and reviewed the above note, making necessary revisions in blue.   Dr. Garry Heater, DO, PGY2 06/09/2015; 9:46 PM

## 2015-06-09 NOTE — Consult Note (Addendum)
Referring Physician: Gwendolyn Grant    Chief Complaint: Weakness, difficulty with speech  HPI: Scott Conway is an 49 y.o. male who woke up today normal.  Went to the gym and on returning sat in a chair in the kitchen and was noted by his fiance to fall out of the chair.  He got up and went to the bathroom where he fell again.  EMS was called.  Patient was noted to have some upper extremity shaking and in attempting to respond to questioning was stuttering.  Code stroke was called on arrival to [facility].  Initial NIHSS of 13.  Date last known well: Date: 06/09/2015 Time last known well: Time: 08:30 tPA Given: No: Symptoms improving  History reviewed. No pertinent past medical history.  History reviewed. No pertinent past surgical history.  Family history: Unable to obtain from patient due to extreme stuttering and crying.    Social History:  reports that he has never smoked. He does not have any smokeless tobacco history on file. He reports that he drinks alcohol. His drug history is not on file.  Allergies: No Known Allergies  Medications: I have reviewed the patient's current medications. Prior to Admission:  Prior to Admission medications   Medication Sig Start Date End Date Taking? Authorizing Provider  acetaminophen (TYLENOL) 500 MG tablet Take 500 mg by mouth every 6 (six) hours as needed for mild pain.   Yes Historical Provider, MD  ibuprofen (ADVIL,MOTRIN) 200 MG tablet Take 200 mg by mouth every 6 (six) hours as needed for headache.   Yes Historical Provider, MD    ROS: Unable to obtain.  Does no subscribe to pain  Physical Examination: Blood pressure 124/74, pulse 83, temperature 98.2 F (36.8 C), temperature source Oral, resp. rate 22, height  (1.803 m), weight 122.471 kg (270 lb), SpO2 95 %.  HEENT-  Normocephalic, no lesions, face red.  Normal external eye and conjunctiva.  Normal TM's bilaterally.  Normal auditory canals and external ears. Normal external nose, mucus  membranes and septum.  Normal pharynx. Cardiovascular- S1, S2 normal, pulses palpable throughout   Lungs- chest clear, no wheezing, rales, normal symmetric air entry Abdomen- soft, non-tender; bowel sounds normal; no masses,  no organomegaly Extremities- no edema Lymph-no adenopathy palpable Musculoskeletal-no joint tenderness, deformity or swelling Skin-areas of escoriation on shiins bilaterally  Neurological Examination Mental Status: Alert.  Stuttering speech.  Follows commands.  Very emotional and crying often.   Cranial Nerves: II: Discs flat bilaterally; Blinks to bilateral confrontation, pupils equal, round, reactive to light and accommodation III,IV, VI: ptosis not present, extra-ocular motions intact bilaterally V,VII: corneals intact, facial light touch sensation normal bilaterally VIII: hearing normal bilaterally IX,X: gag reflex reduced XI: unable to perform shoulder shrug XII: unable to perform tongue extension Motor: Able to lift each arm only a few inches off the bed. When arm actively lifted over head does not fall back to face.  Unable to lift legs off the bed.  Has tremor of both upper extremities but most pronounced in RUE.   Sensory: Pinprick and light touch intact throughout, bilaterally Deep Tendon Reflexes: 2+ and symmetric throughout Plantars: Right: downgoing   Left: downgoing Cerebellar: Unable to perform finger to nose and heel to shin testing Gait: not tested due to safety concerns   Laboratory Studies:  Basic Metabolic Panel:  Recent Labs Lab 06/09/15 1130 06/09/15 1141  NA 139 139  K 4.7 4.5  CL 103 102  CO2 26  --   GLUCOSE 114*  112*  BUN 18 20  CREATININE 1.51* 1.50*  CALCIUM 9.6  --     Liver Function Tests:  Recent Labs Lab 06/09/15 1130  AST 33  ALT 35  ALKPHOS 63  BILITOT 0.8  PROT 7.0  ALBUMIN 3.8   No results for input(s): LIPASE, AMYLASE in the last 168 hours. No results for input(s): AMMONIA in the last 168  hours.  CBC:  Recent Labs Lab 06/09/15 1130 06/09/15 1141  WBC 7.4  --   NEUTROABS 5.5  --   HGB 15.9 16.7  HCT 45.0 49.0  MCV 90.4  --   PLT 183  --     Cardiac Enzymes: No results for input(s): CKTOTAL, CKMB, CKMBINDEX, TROPONINI in the last 168 hours.  BNP: Invalid input(s): POCBNP  CBG:  Recent Labs Lab 06/09/15 1140  GLUCAP 93    Microbiology: No results found for this or any previous visit.  Coagulation Studies:  Recent Labs  06/09/15 1130  LABPROT 13.7  INR 1.03    Urinalysis: No results for input(s): COLORURINE, LABSPEC, PHURINE, GLUCOSEU, HGBUR, BILIRUBINUR, KETONESUR, PROTEINUR, UROBILINOGEN, NITRITE, LEUKOCYTESUR in the last 168 hours.  Invalid input(s): APPERANCEUR  Lipid Panel: No results found for: CHOL, TRIG, HDL, CHOLHDL, VLDL, LDLCALC  HgbA1C: No results found for: HGBA1C  Urine Drug Screen:  No results found for: LABOPIA, COCAINSCRNUR, LABBENZ, AMPHETMU, THCU, LABBARB  Alcohol Level: No results for input(s): ETH in the last 168 hours.  Other results: EKG: sinus rhythm at 80 bpm.  Imaging: Ct Head Wo Contrast  06/09/2015  CLINICAL DATA:  Last seen well 0830am, Pt came home from the gym and sat down at the kitchen table and then fell out into the floor. Got up to go to the bathroom and fell again. Unable to get his words out. Uncontrolled tremor of all extremities EXAM: CT HEAD WITHOUT CONTRAST CT CERVICAL SPINE WITHOUT CONTRAST TECHNIQUE: Multidetector CT imaging of the head and cervical spine was performed following the standard protocol without intravenous contrast. Multiplanar CT image reconstructions of the cervical spine were also generated. COMPARISON:  Head CT 11/01/2010 FINDINGS: CT HEAD FINDINGS Patient is scanned in nonstandard head position which generates streak artifact through the posterior fossa from the dental amalgam. No acute intracranial hemorrhage. No focal mass lesion. No CT evidence of acute infarction. No midline  shift or mass effect. 2 punctate calcifications within the LEFT internal capsule (image 18, series 20 1) not changed from prior. No hydrocephalus. Basilar cisterns are patent. CT CERVICAL SPINE FINDINGS No prevertebral soft tissue swelling. Normal alignment of cervical vertebral bodies. No loss of vertebral body height. Normal facet articulation. Normal craniocervical junction.No evidence epidural or paraspinal hematoma. IMPRESSION: 1. No acute intracranial findings. No CT evidence acute infarction. No evidence of intracranial hemorrhage. 2. No cervical spine fracture. Electronically Signed   By: Genevive Bi M.D.   On: 06/09/2015 11:47   Ct Cervical Spine Wo Contrast  06/09/2015  CLINICAL DATA:  Last seen well 0830am, Pt came home from the gym and sat down at the kitchen table and then fell out into the floor. Got up to go to the bathroom and fell again. Unable to get his words out. Uncontrolled tremor of all extremities EXAM: CT HEAD WITHOUT CONTRAST CT CERVICAL SPINE WITHOUT CONTRAST TECHNIQUE: Multidetector CT imaging of the head and cervical spine was performed following the standard protocol without intravenous contrast. Multiplanar CT image reconstructions of the cervical spine were also generated. COMPARISON:  Head CT 11/01/2010 FINDINGS: CT HEAD  FINDINGS Patient is scanned in nonstandard head position which generates streak artifact through the posterior fossa from the dental amalgam. No acute intracranial hemorrhage. No focal mass lesion. No CT evidence of acute infarction. No midline shift or mass effect. 2 punctate calcifications within the LEFT internal capsule (image 18, series 20 1) not changed from prior. No hydrocephalus. Basilar cisterns are patent. CT CERVICAL SPINE FINDINGS No prevertebral soft tissue swelling. Normal alignment of cervical vertebral bodies. No loss of vertebral body height. Normal facet articulation. Normal craniocervical junction.No evidence epidural or paraspinal  hematoma. IMPRESSION: 1. No acute intracranial findings. No CT evidence acute infarction. No evidence of intracranial hemorrhage. 2. No cervical spine fracture. Electronically Signed   By: Genevive BiStewart  Edmunds M.D.   On: 06/09/2015 11:47   Ct Angio Chest Aorta W/cm &/or Wo/cm  06/09/2015  CLINICAL DATA:  Pt had syncopal episode twice today. R/o thoracic dissection No PMH Returned home from the gym and had two falls EXAM: CT ANGIOGRAPHY CHEST WITH CONTRAST TECHNIQUE: Multidetector CT imaging of the chest was performed using the standard protocol during bolus administration of intravenous contrast. Multiplanar CT image reconstructions and MIPs were obtained to evaluate the vascular anatomy. CONTRAST:  130mL OMNIPAQUE IOHEXOL 350 MG/ML SOLN FINDINGS: The non contrast scan shows no mediastinal hematoma, thoracic aneurysm, or hyperdense crescent. CTA via left arm IV contrast injection. Innominate vein and SVC patent. Incomplete opacification of pulmonary artery branches; the exam was not optimized for detection of pulmonary emboli. Scattered coronary calcifications. Adequate contrast opacification of the thoracic aorta with no evidence of dissection, aneurysm, or stenosis. There is classic 3-vessel brachiocephalic arch anatomy without proximal stenosis. No significant atheromatous plaque. No hilar or mediastinal adenopathy. No pleural or pericardial effusion. Mild dependent atelectasis posteriorly in both lower lobes. Continued breathing motion degrades images through the lower lobes. Lungs otherwise clear. Mild spondylitic changes in the lower lumbar spine. Sternum intact. Visualized portions of upper abdomen unremarkable. Review of the MIP images confirms the above findings. IMPRESSION: 1. Negative for thoracic aortic dissection or other acute vascular finding. 2. Atherosclerosis, including coronary artery disease. Please note that although the presence of coronary artery calcium documents the presence of coronary  artery disease, the severity of this disease and any potential stenosis cannot be assessed on this non-gated CT examination. Assessment for potential risk factor modification, dietary therapy or pharmacologic therapy may be warranted, if clinically indicated. Electronically Signed   By: Corlis Leak  Hassell M.D.   On: 06/09/2015 12:40    Assessment: 49 y.o. male presenting with generalized weakness and stuttering speech.  No focality noted on neurological examination.  Head CT personally reviewed and shows no acute changes.  Can not rule out some posterior circulation event with patient falls.  Seizure remains on teh differential as well.  Further work up recommended.    Stroke Risk Factors - none  Plan: 1. HgbA1c, fasting lipid panel 2. CTA head and neck 3. NPO until RN stroke swallow screen 4. Telemetry monitoring 5. Frequent neuro checks 6. EEG 7. UDS   Thana FarrLeslie Gailya Tauer, MD [facility] [phone] 06/09/2015, 1:56 PM

## 2015-06-09 NOTE — Progress Notes (Signed)
Patient transferred from ER via stretcher on tele by ER RN. Patient ambulated from stretcher to bed approximately 10 feet, mild unsteady gait. Oriented to unit and room, instructed on callbell and placed at side. Belongings at bedside. Patient's wife at bedside.

## 2015-06-09 NOTE — ED Notes (Signed)
Spoke with Dr. Alanda SlimGonfa regarding change in bed request.  States due to q2 hour neuro checks and MRI ordered pt needs Stepdown bed.  Angelique Blonderenise, Rapid Response nurse notified of same and will follow-up with admitting MD.

## 2015-06-09 NOTE — ED Provider Notes (Signed)
CSN: [id number]     Arrival date & time 06/09/15  1119 History   First MD Initiated Contact with Patient 06/09/15 1120     Chief Complaint  Patient presents with  . Code Stroke     (Consider location/radiation/quality/duration/timing/severity/associated sxs/prior Treatment) HPI Comments: 49 year old male here after falling in the kitchen. He just finished working out who is sitting in the chair in the kitchen and fell over. He had some shaking of his upper extremities but no generalized seizure activity. He lost consciousness per the wife. He fell again after getting up and going to the bathroom. He was called as a code stroke and route due to stuttering and shaking.  Patient is a 49 y.o. male presenting with neurologic complaint. The history is provided by the patient, a relative and the spouse.  Neurologic Problem This is a new problem. The current episode started 1 to 2 hours ago. The problem occurs constantly. The problem has not changed since onset.Pertinent negatives include no chest pain and no abdominal pain. Nothing aggravates the symptoms. Nothing relieves the symptoms.    No past medical history on file. No past surgical history on file. No family history on file. Social History  Substance Use Topics  . Smoking status: Not on file  . Smokeless tobacco: Not on file  . Alcohol Use: Not on file    Review of Systems  Unable to perform ROS: Acuity of condition  Cardiovascular: Negative for chest pain.  Gastrointestinal: Negative for abdominal pain.      Allergies  Review of patient's allergies indicates not on file.  Home Medications   Prior to Admission medications   Not on File   BP 129/91 mmHg  Pulse 79  Temp(Src) 98.1 F (36.7 C) (Oral)  Resp 20  Ht  (1.803 m)  Wt 270 lb (122.471 kg)  BMI 37.67 kg/m2  SpO2 100% Physical Exam  Constitutional: He is oriented to person, place, and time. He appears well-developed and well-nourished. No distress.   HENT:  Head: Normocephalic and atraumatic.  Mouth/Throat: No oropharyngeal exudate.  Eyes: EOM are normal. Pupils are equal, round, and reactive to light.  Neck: Normal range of motion. Neck supple.  Cardiovascular: Normal rate and regular rhythm.  Exam reveals no friction rub.   No murmur heard. Pulmonary/Chest: Effort normal and breath sounds normal. No respiratory distress. He has no wheezes. He has no rales.  Abdominal: He exhibits no distension. There is no tenderness. There is no rebound.  Musculoskeletal: Normal range of motion. He exhibits no edema.  Neurological: He is alert and oriented to person, place, and time. No cranial nerve deficit or sensory deficit. GCS eye subscore is 4. GCS verbal subscore is 5. GCS motor subscore is 6.  Diffusely weak Speech is stuttering, patient cries when he cannot get complete speech portion of exam.  Skin: He is not diaphoretic.  Nursing note and vitals reviewed.   ED Course  Procedures (including critical care time) Labs Review Labs Reviewed  PROTIME-INR  APTT  CBC  DIFFERENTIAL  COMPREHENSIVE METABOLIC PANEL  I-STAT TROPOININ, ED  I-STAT CHEM 8, ED  CBG MONITORING, ED    Imaging Review Ct Head Wo Contrast  06/09/2015  CLINICAL DATA:  Last seen well 0830am, Pt came home from the gym and sat down at the kitchen table and then fell out into the floor. Got up to go to the bathroom and fell again. Unable to get his words out. Uncontrolled tremor of all extremities EXAM:  CT HEAD WITHOUT CONTRAST CT CERVICAL SPINE WITHOUT CONTRAST TECHNIQUE: Multidetector CT imaging of the head and cervical spine was performed following the standard protocol without intravenous contrast. Multiplanar CT image reconstructions of the cervical spine were also generated. COMPARISON:  Head CT 11/01/2010 FINDINGS: CT HEAD FINDINGS Patient is scanned in nonstandard head position which generates streak artifact through the posterior fossa from the dental amalgam. No  acute intracranial hemorrhage. No focal mass lesion. No CT evidence of acute infarction. No midline shift or mass effect. 2 punctate calcifications within the LEFT internal capsule (image 18, series 20 1) not changed from prior. No hydrocephalus. Basilar cisterns are patent. CT CERVICAL SPINE FINDINGS No prevertebral soft tissue swelling. Normal alignment of cervical vertebral bodies. No loss of vertebral body height. Normal facet articulation. Normal craniocervical junction.No evidence epidural or paraspinal hematoma. IMPRESSION: 1. No acute intracranial findings. No CT evidence acute infarction. No evidence of intracranial hemorrhage. 2. No cervical spine fracture. Electronically Signed   By: Genevive Bi M.D.   On: 06/09/2015 11:47   Ct Cervical Spine Wo Contrast  06/09/2015  CLINICAL DATA:  Last seen well 0830am, Pt came home from the gym and sat down at the kitchen table and then fell out into the floor. Got up to go to the bathroom and fell again. Unable to get his words out. Uncontrolled tremor of all extremities EXAM: CT HEAD WITHOUT CONTRAST CT CERVICAL SPINE WITHOUT CONTRAST TECHNIQUE: Multidetector CT imaging of the head and cervical spine was performed following the standard protocol without intravenous contrast. Multiplanar CT image reconstructions of the cervical spine were also generated. COMPARISON:  Head CT 11/01/2010 FINDINGS: CT HEAD FINDINGS Patient is scanned in nonstandard head position which generates streak artifact through the posterior fossa from the dental amalgam. No acute intracranial hemorrhage. No focal mass lesion. No CT evidence of acute infarction. No midline shift or mass effect. 2 punctate calcifications within the LEFT internal capsule (image 18, series 20 1) not changed from prior. No hydrocephalus. Basilar cisterns are patent. CT CERVICAL SPINE FINDINGS No prevertebral soft tissue swelling. Normal alignment of cervical vertebral bodies. No loss of vertebral body height.  Normal facet articulation. Normal craniocervical junction.No evidence epidural or paraspinal hematoma. IMPRESSION: 1. No acute intracranial findings. No CT evidence acute infarction. No evidence of intracranial hemorrhage. 2. No cervical spine fracture. Electronically Signed   By: Genevive Bi M.D.   On: 06/09/2015 11:47   Ct Angio Chest Aorta W/cm &/or Wo/cm  06/09/2015  CLINICAL DATA:  Pt had syncopal episode twice today. R/o thoracic dissection No PMH Returned home from the gym and had two falls EXAM: CT ANGIOGRAPHY CHEST WITH CONTRAST TECHNIQUE: Multidetector CT imaging of the chest was performed using the standard protocol during bolus administration of intravenous contrast. Multiplanar CT image reconstructions and MIPs were obtained to evaluate the vascular anatomy. CONTRAST:  OMNIPAQUE IOHEXOL 350 MG/ML SOLN FINDINGS: The non contrast scan shows no mediastinal hematoma, thoracic aneurysm, or hyperdense crescent. CTA via left arm IV contrast injection. Innominate vein and SVC patent. Incomplete opacification of pulmonary artery branches; the exam was not optimized for detection of pulmonary emboli. Scattered coronary calcifications. Adequate contrast opacification of the thoracic aorta with no evidence of dissection, aneurysm, or stenosis. There is classic 3-vessel brachiocephalic arch anatomy without proximal stenosis. No significant atheromatous plaque. No hilar or mediastinal adenopathy. No pleural or pericardial effusion. Mild dependent atelectasis posteriorly in both lower lobes. Continued breathing motion degrades images through the lower lobes. Lungs otherwise clear.  Mild spondylitic changes in the lower lumbar spine. Sternum intact. Visualized portions of upper abdomen unremarkable. Review of the MIP images confirms the above findings. IMPRESSION: 1. Negative for thoracic aortic dissection or other acute vascular finding. 2. Atherosclerosis, including coronary artery disease. Please note  that although the presence of coronary artery calcium documents the presence of coronary artery disease, the severity of this disease and any potential stenosis cannot be assessed on this non-gated CT examination. Assessment for potential risk factor modification, dietary therapy or pharmacologic therapy may be warranted, if clinically indicated. Electronically Signed   By: Corlis Leak  Hassell M.D.   On: 06/09/2015 12:40   I have personally reviewed and evaluated these images and lab results as part of my medical decision-making.   EKG Interpretation   Date/Time:  Sunday June 09 2015 11:40:59 EDT Ventricular Rate:  80 PR Interval:  139 QRS Duration: 102 QT Interval:  356 QTC Calculation: 411 R Axis:   -5 Text Interpretation:  Sinus rhythm Consider right atrial enlargement  Posterior infarct, old Borderline T abnormalities, inferior leads No  significant change since last tracing Confirmed by Gwendolyn GrantWALDEN  MD, Carrson Lightcap  ([id number]) on 06/09/2015 11:45:49 AM      MDM   Final diagnoses:  Syncope, unspecified syncope type    49 year old male here with stuttering and shaking after syncopal episode. Initial head CT normal. Was reviewed by me and the neurologist. Here he is stuttering, moves all extremities but is diffusely weak. His right arm is continually shaking. He has difficulty getting words out, but can follow commands. He cries when he cannot complete the speech portion of the neurologic exam. Will obtain CT ages of his head, neck, chest.  Ct Angio of the chest normal. Dr. Thad Rangereynolds stated her initial assessment of the neck and head angios were without problem.   Patient admitted for further workup with Family Medicine. On re-exam, he's relaxing comfortably, speaking normally, no problems with motor function. No tremor.  Elwin MochaBlair Karaline Buresh, MD 06/09/15 [phone]

## 2015-06-09 NOTE — ED Notes (Signed)
Per [facility], pt fiance saw him at 830 this morning before he went to the gym, LSN. Pt came home, fell out of the chair at the kitchen table, got up and went to the bathroom and fell again. Pt no PMH, able to answer questions but with extreme stuttering and shaking. Fiance states she saw possible siezurelike activity.

## 2015-06-09 NOTE — ED Notes (Signed)
Pt on CT table at this time

## 2015-06-09 NOTE — ED Notes (Signed)
Pt returned from xray

## 2015-06-09 NOTE — ED Notes (Signed)
Report was called to floor by Trula Orehristina, RN.  Once patient transferred to floor by EMT, 5W staff informed EMT that bed assignment had been changed to stepdown and that pt needed to return to ED.  Pt was brought back to ED by EMT.  Admitting MD has been paged regarding change in admission orders.

## 2015-06-09 NOTE — ED Notes (Signed)
This nurse and stroke nurse transported PT back to CT

## 2015-06-09 NOTE — ED Notes (Signed)
Pt reutrned to room with this nurse, no adverse events. Per Dr. Thad Rangereynolds, q2h neuro checks

## 2015-06-09 NOTE — ED Notes (Signed)
Pt c/o R shoulder pain. Dr. Gwendolyn GrantWalden notified

## 2015-06-09 NOTE — ED Notes (Signed)
Pt transported on the zole monitor

## 2015-06-10 ENCOUNTER — Encounter (HOSPITAL_COMMUNITY): Payer: Self-pay | Admitting: Family Medicine

## 2015-06-10 ENCOUNTER — Observation Stay (HOSPITAL_COMMUNITY): Payer: Self-pay

## 2015-06-10 ENCOUNTER — Inpatient Hospital Stay (HOSPITAL_COMMUNITY): Payer: Self-pay

## 2015-06-10 DIAGNOSIS — W19XXXA Unspecified fall, initial encounter: Secondary | ICD-10-CM | POA: Diagnosis present

## 2015-06-10 DIAGNOSIS — R55 Syncope and collapse: Principal | ICD-10-CM

## 2015-06-10 DIAGNOSIS — R404 Transient alteration of awareness: Secondary | ICD-10-CM

## 2015-06-10 DIAGNOSIS — W19XXXD Unspecified fall, subsequent encounter: Secondary | ICD-10-CM

## 2015-06-10 DIAGNOSIS — R412 Retrograde amnesia: Secondary | ICD-10-CM

## 2015-06-10 DIAGNOSIS — R4020 Unspecified coma: Secondary | ICD-10-CM | POA: Diagnosis present

## 2015-06-10 HISTORY — DX: Retrograde amnesia: R41.2

## 2015-06-10 LAB — BASIC METABOLIC PANEL
Anion gap: 5 (ref 5–15)
BUN: 16 mg/dL (ref 6–20)
CALCIUM: 8.6 mg/dL — AB (ref 8.9–10.3)
CO2: 25 mmol/L (ref 22–32)
CREATININE: 1.26 mg/dL — AB (ref 0.61–1.24)
Chloride: 108 mmol/L (ref 101–111)
GFR calc Af Amer: >60 mL/min (ref >60)
GLUCOSE: 131 mg/dL — AB (ref 65–99)
Potassium: 3.7 mmol/L (ref 3.5–5.1)
SODIUM: 138 mmol/L (ref 135–145)

## 2015-06-10 LAB — HEMOGLOBIN A1C
HEMOGLOBIN A1C: 5.8 % — AB (ref 4.8–5.6)
Mean Plasma Glucose: 120 mg/dL

## 2015-06-10 MED ORDER — GADOBENATE DIMEGLUMINE 529 MG/ML IV SOLN
20.0000 mL | Freq: Once | INTRAVENOUS | Status: AC | PRN
  Administered 2015-06-10: 20 mL via INTRAVENOUS

## 2015-06-10 NOTE — Discharge Summary (Signed)
Family Medicine Teaching [facility] Discharge Summary  Patient name: Scott Conway Medical record number: [id number] Date of birth: April 02, 1966 Age: 49 y.o. Gender: male Date of Admission: 06/09/2015  Date of Discharge: 06/10/2015 Admitting Physician: Leighton Roach McDiarmid, MD  Primary Care Provider: No primary care provider on file. Consultants: Neurology Indication for Hospitalization: fall and loss of consciousness   Discharge Diagnoses/Problem List:  Status post fall and loss of consciousness  Disposition: home  Discharge Condition: improved and stable  Discharge Exam:  Gen: well-appearing, sitting on the chair getting ready to go home.  Eyes: PERRLA Oropharynx: clear, moist CV: RRR. S1 & S2 audible, no murmurs. Resp: no apparent work of breathing, clear to auscultation bilaterally. Abd: +BS. Soft, NDNT, no rebound or guarding.  Ext: No edema or gross deformities. Neuro: Alert and oriented, No gross focal deficits, ambulating in the room without difficulty.  Brief Hospital Course:  Scott Conway is a 49 y.o. male with no past medical history who presents via EMS with fall and loss of consciousness at home.  Fall/LOC/Shaking: likely seizure activity. He appears exhausted and fatigued which was concerning for post-ictal state. However, there is no sign of tongue bite or incontinence. EEG was also unrevealing. Unlikely to be syncopal episode with prolonged loss of consciousness and prolonged fatigue after the event. He had some T wave changes on EKG but POCT and follow up troponin were is negative. He denies chest pain or shortness of breath as well. HEART score 2. Echo with EF of 60-65% and grade 2 diastolic dysfunction with no other abnormalities. CTA chest negative aortic dissection as well. Unlikely to be CVA given no focal neurologic finding. CT head and neck,  CTA of his head and neck, and MRI brain were all unrevealing. Point of care glucose 93. CMP within normal limit to  suspect electrolyte imbalance. He had no fever and leukocytosis to suspect Infectious processes. UDS negative. Creatinine kinase within normal range. With all these ruled out, the suspicion has turned to the supplements he take for workout, which is called pre-workout. He reports using two serving of this. This supplement has got 178 mg caffeine per serving. However, he has been taking this the same way for long time. Despite the difficulty to identifying the culprit, patients clinical picture improved on arrival in the hospital although he looked fatigued. On the day of discharge, he was up walking in the room. Neurologic exam with no focal deficit. He was discharged home for follow up with his primary care physician. It is recommended that he avoid the supplements as well.  Elevated Cr to 1.51 on admission trended down to 1.26 this morning. Expect higher side of normal range as due to patient's muscle mass. He could also be dehydrated.  Issues for Follow Up:  1. Fall and loss of consciousnes  Significant Procedures: none  Significant Labs and Imaging:   Recent Labs Lab 06/09/15 1130 06/09/15 1141  WBC 7.4  --   HGB 15.9 16.7  HCT 45.0 49.0  PLT 183  --     Recent Labs Lab 06/09/15 1130 06/09/15 1141 06/10/15 0307  NA 139 139 138  K 4.7 4.5 3.7  CL 103 102 108  CO2 26  --  25  GLUCOSE 114* 112* 131*  BUN CREATININE 1.51* 1.50* 1.26*  CALCIUM 9.6  --  8.6*  ALKPHOS 63  --   --   AST 33  --   --   ALT 35  --   --  ALBUMIN 3.8  --   --     Results/Tests Pending at Time of Discharge:   Discharge Medications:    Medication List    TAKE these medications        acetaminophen 500 MG tablet  Commonly known as:  TYLENOL  Take 500 mg by mouth every 6 (six) hours as needed for mild pain.     ibuprofen 200 MG tablet  Commonly known as:  ADVIL,MOTRIN  Take 200 mg by mouth every 6 (six) hours as needed for headache.        Discharge Instructions: Please refer  to Patient Instructions section of EMR for full details.  Patient was counseled important signs and symptoms that should prompt return to medical care, changes in medications, dietary instructions, activity restrictions, and follow up appointments.   Follow-Up Appointments:     Follow-up Information    [facility] On 06/14/2015.   Specialty:  Family Medicine   Why:  10:00 Am   Contact information:   [address] [place] [id number] [phone]       Almon Herculesaye T Nekhi Liwanag, MD 06/10/2015, 5:33 PM PGY-1, [facility]

## 2015-06-10 NOTE — Progress Notes (Signed)
EEG completed; results pending.    

## 2015-06-10 NOTE — Progress Notes (Signed)
  Echocardiogram 2D Echocardiogram has been performed.  Janalyn HarderWest, Myrtle Haller R 06/10/2015, 2:43 PM

## 2015-06-10 NOTE — Progress Notes (Signed)
Patient discharged home with wife.  Patient given instructions on PRN medications, follow up appointments, and information regarding this visit (ie: explanation of near syncopal episode and instructed not drive until follow up appointment).  Patient and wife stated understanding of instructions.

## 2015-06-10 NOTE — Progress Notes (Signed)
Subjective: Back to baseline.   Exam: Filed Vitals:   06/10/15 1629  BP: 117/65  Pulse: 60  Temp: 97.9 F (36.6 C)  Resp: 24   Gen: In bed, NAD Resp: non-labored breathing, no acute distress Abd: soft, nt  Neuro: MS: awake, alert, interactive and appropriate  UJ:WJXBJCN:PERRL, EOMI Motor: 5/5 throughout  Pertinent Labs: Ck, tsh wnl   Impression: 49 yo M with transient episode of generalized weakness and difficulty speaking. The duration and character would argue against seizure and with a negative EEG, would not pursue this further unless further spells occurred. Without focality, it is difficult to make an argument for TIA as well. It is possible that this is related to his supplements. I would not favor further workup at this time unless further episodes occur.   Recommendations: 1) Agree with FM regarding limiting supplement use.  2) No further recs at this time. Please call with further questions or concerns.   Ritta SlotMcNeill Criag Wicklund, MD [facility] [phone]  If 7pm- 7am, please page neurology on call as listed in AMION.

## 2015-06-10 NOTE — Progress Notes (Cosign Needed)
Family Medicine Teaching Service Daily Progress Note Intern Pager: [phone]  Patient name: Scott Conway Medical record number: [id number] Date of birth: 1965/10/19 Age: 49 y.o. Gender: male  Primary Care Provider: No primary care provider on file. Consultants: neurology Code Status: full  Pt Overview and Major Events to Date:  10/30-admitted with fall and LOC  Assessment and Plan: Scott Conway is a 49 y.o. male presenting with presenting with fall, LOC and seizure like activity. He has no PMH  Fall/LOC/Shaking: likely seizure activity. He appears exhausted and fatigued which is suggestive for post-ictal state. However, there is no sign of tongue bite or incontinence. Unlikely to be syncopal episode with prolonged loss of consciousness and post-syncopal signs. Unlikely to be CVA given no focal neurologic finding and negative CT and CTA of his head and neck. CTA chest negative aortic dissection. He has some T wave changes on EKG but POCT is negative. Follow up troponin also negative. He denies chest pain or shortness of breath as well. HEART score of 2. Mother died of sudden heart attack without prior diagnosis although at age 82 . Point of care glucose 93 which makes hypoglycemia unlikely. CMP within normal limit to suspect electrolyte imbalance. Infectious etiologies unlikely without fever and leukocytosis. UDS negative. CK, EEG and MRI brain unrevealing.  Initial EKG with some TW changes. Echo with EF of 60-65% and grade 2 diastolic dysfunction with no other abnormalities. Patients clinical picture improved. He was up walking in the room. Neurologic exam with no focal deficit.   Elevated Cr to 1.51 on admission trended down to 1.26 this morning. Expect higher side of normal range as due to patient's muscle mass. He could also be dehydrated.  Right shoulder pain: chronic. X-ray negative for fracture or dislocation but significant for mild degenerative changes -Tylenol as needed  pain  FEN/GI: -IVF as above -regular diet (passed SLP in ED)  Prophylaxis: Lovenex  Disposition: discharge home today.  Subjective:  Says he is tired because he wasn't able to sleep last night as got on being awakened frequently. No other complaints.   Objective: Temp:  [97.9 F (36.6 C)-98.7 F (37.1 C)] 98 F (36.7 C) (10/31 0300) Pulse Rate:  [52-83] 65 (10/31 0300) Resp:  [14-23] 17 (10/31 0300) BP: (110-156)/(53-91) 118/66 mmHg (10/31 0300) SpO2:  [94 %-100 %] 98 % (10/31 0300) Weight:  [270 lb (122.471 kg)-272 lb 14.9 oz (123.8 kg)] 272 lb 14.9 oz (123.8 kg) (10/30 1840) Physical Exam: Physical Exam Gen:  well-appearing, sitting on the chair getting ready to go home.  Eyes: PERRLA Oropharynx: clear, moist CV: RRR. S1 & S2 audible, no murmurs. Resp: no apparent WOB, CTAB. Abd: +BS. Soft, NDNT, no rebound or guarding.  Ext: No edema or gross deformities. Neuro: Alert and oriented, No gross focal deficits, ambulating in the room without difficulty. Laboratory:  Recent Labs Lab 06/09/15 1130 06/09/15 1141  WBC 7.4  --   HGB 15.9 16.7  HCT 45.0 49.0  PLT 183  --     Recent Labs Lab 06/09/15 1130 06/09/15 1141 06/10/15 0307  NA 139 139 138  K 4.7 4.5 3.7  CL 103 102 108  CO2 26  --  25  BUN CREATININE 1.51* 1.50* 1.26*  CALCIUM 9.6  --  8.6*  PROT 7.0  --   --   BILITOT 0.8  --   --   ALKPHOS 63  --   --   ALT 35  --   --  AST 33  --   --   GLUCOSE 114* 112* 131*    Imaging/Diagnostic Tests: Mr Lodema PilotBrain W Wo Contrast  06/10/2015  CLINICAL DATA:  49 year old male with loss of consciousness and abnormal speech on 06/09/2015. Initial encounter. EXAM: MRI HEAD WITHOUT AND WITH CONTRAST TECHNIQUE: Multiplanar, multiecho pulse sequences of the brain and surrounding structures were obtained without and with intravenous contrast. CONTRAST:  20mL MULTIHANCE GADOBENATE DIMEGLUMINE 529 MG/ML IV SOLN COMPARISON:  CTA head and neck from today reported  separately. Noncontrast head CT and cervical spine 06/09/2015. FINDINGS: Cerebral volume is within normal limits for age. No restricted diffusion to suggest acute infarction. No midline shift, mass effect, evidence of mass lesion, ventriculomegaly, extra-axial collection or acute intracranial hemorrhage. Cervicomedullary junction and pituitary are within normal limits. Major intracranial vascular flow voids are within normal limits. Wallace CullensGray and white matter signal is within normal limits for age throughout the brain. No abnormal enhancement identified. No dural thickening. Visible internal auditory structures appear normal. Mastoids are clear. Trace paranasal sinus mucosal thickening. Negative orbit and scalp soft tissues. Negative visualized cervical spine. Normal bone marrow signal. IMPRESSION: Normal for age MRI appearance of the brain. Electronically Signed   By: Odessa FlemingH  Hall M.D.   On: 06/10/2015 09:32    Almon Herculesaye T Gonfa, MD 06/10/2015, 7:25 AM PGY-1, [facility] FPTS Intern pager: [phone], text pages welcome

## 2015-06-10 NOTE — Discharge Instructions (Signed)
DO NOT DRIVE until you are cleared at your follow up appointment STOP taking any exercise supplement that may cause lightheadedness or dizziness  Follow-up Information    Follow up with [facility], DO On 06/14/2015.   Specialty:  Family Medicine   Why:  10:00 Am   Contact information:   [address] [place] [address] [phone]       Near-Syncope Near-syncope (commonly known as near fainting) is sudden weakness, dizziness, or feeling like you might pass out. This can happen when getting up or while standing for a long time. It is caused by a sudden decrease in blood flow to the brain, which can occur for various reasons. Most of the reasons are not serious.  HOME CARE Watch your condition for any changes.  Have someone stay with you until you feel stable.  If you feel like you are going to pass out:  Lie down right away.  Prop your feet up if you can.  Breathe deeply and steadily.  Move only when the feeling has gone away. Most of the time, this feeling lasts only a few minutes. You may feel tired for several hours.  Drink enough fluids to keep your pee (urine) clear or pale yellow.  If you are taking blood pressure or heart medicine, stand up slowly.  Follow up with your doctor as told. GET HELP RIGHT AWAY IF:   You have a severe headache.  You have unusual pain in the chest, belly (abdomen), or back.  You have bleeding from the mouth or butt (rectum), or you have black or tarry poop (stool).  You feel your heart beat differently than normal, or you have a very fast pulse.  You pass out, or you twitch and shake when you pass out.  You pass out when sitting or lying down.  You feel confused.  You have trouble walking.  You are weak.  You have vision problems. MAKE SURE YOU:   Understand these instructions.  Will watch your condition.  Will get help right away if you are not doing well or get worse.   This information is not intended  to replace advice given to you by your health care provider. Make sure you discuss any questions you have with your health care provider.   Document Released: 01/13/2008 Document Revised: 08/17/2014 Document Reviewed: 12/30/2012 Elsevier Interactive Patient Education 2016 [employer].  Near-Syncope Near-syncope (commonly known as near fainting) is sudden weakness, dizziness, or feeling like you might pass out. This can happen when getting up or while standing for a long time. It is caused by a sudden decrease in blood flow to the brain, which can occur for various reasons. Most of the reasons are not serious.  HOME CARE Watch your condition for any changes.  Have someone stay with you until you feel stable.  If you feel like you are going to pass out:  Lie down right away.  Prop your feet up if you can.  Breathe deeply and steadily.  Move only when the feeling has gone away. Most of the time, this feeling lasts only a few minutes. You may feel tired for several hours.  Drink enough fluids to keep your pee (urine) clear or pale yellow.  If you are taking blood pressure or heart medicine, stand up slowly.  Follow up with your doctor as told. GET HELP RIGHT AWAY IF:   You have a severe headache.  You have unusual pain in the chest, belly (abdomen),  or back.  You have bleeding from the mouth or butt (rectum), or you have black or tarry poop (stool).  You feel your heart beat differently than normal, or you have a very fast pulse.  You pass out, or you twitch and shake when you pass out.  You pass out when sitting or lying down.  You feel confused.  You have trouble walking.  You are weak.  You have vision problems. MAKE SURE YOU:   Understand these instructions.  Will watch your condition.  Will get help right away if you are not doing well or get worse.   This information is not intended to replace advice given to you by your health care provider. Make sure you  discuss any questions you have with your health care provider.   Document Released: 01/13/2008 Document Revised: 08/17/2014 Document Reviewed: 12/30/2012 Elsevier Interactive Patient Education [employer] Inc[employer]

## 2015-06-10 NOTE — Procedures (Signed)
ELECTROENCEPHALOGRAM REPORT  Patient: Scott SchmidtDavid Heinsohn       Room #: 1O103S02 EEG No. ID: [id number] Age: 49 y.o.        Sex: male Referring Physician: MCDIARMID, T Report Date:  06/10/2015        Interpreting Physician: Aline BrochureSTEWART,Marriana Hibberd R  History: Scott Conway is an 49 y.o. male admitted following possible generalized seizure.  Indications for study:  Rule out seizure disorder.  Technique: This is an 18 channel routine scalp EEG performed at the bedside with bipolar and monopolar montages arranged in accordance to the international 10/20 system of electrode placement.   Description: This EEG recording was performed during wakefulness. Predominant activity consisted of diffuse low amplitude symmetrical fast beta activity, as well as 10 Hz alpha rhythm recorded from the posterior head region. Alpha activity attenuated well with eye opening. Photic stimulation produced symmetrical occipital driving response. Hyperventilation produced normal symmetrical generalized slowing response. No epileptiform discharges were recorded. There was no abnormal slowing of cerebral activity.  Interpretation: This is a normal EEG recording during wakefulness. No evidence of seizure activity was recorded. However, the absence of seizure discharges during an EEG recording does not rule out clinical seizure disorder.   Venetia MaxonR Dainel Arcidiacono M.D. [facility] [phone]

## 2015-06-14 ENCOUNTER — Ambulatory Visit: Payer: Self-pay | Admitting: Family Medicine

## 2016-03-04 ENCOUNTER — Encounter (HOSPITAL_COMMUNITY): Payer: Self-pay | Admitting: Emergency Medicine

## 2016-03-04 ENCOUNTER — Emergency Department (HOSPITAL_COMMUNITY)
Admission: EM | Admit: 2016-03-04 | Discharge: 2016-03-04 | Disposition: A | Discharge status: Home / Self Care | Payer: Self-pay | Attending: Emergency Medicine | Admitting: Emergency Medicine

## 2016-03-04 ENCOUNTER — Emergency Department (HOSPITAL_COMMUNITY): Payer: Self-pay

## 2016-03-04 DIAGNOSIS — Y999 Unspecified external cause status: Secondary | ICD-10-CM | POA: Insufficient documentation

## 2016-03-04 DIAGNOSIS — K08419 Partial loss of teeth due to trauma, unspecified class: Secondary | ICD-10-CM | POA: Insufficient documentation

## 2016-03-04 DIAGNOSIS — Y9355 Activity, bike riding: Secondary | ICD-10-CM | POA: Insufficient documentation

## 2016-03-04 DIAGNOSIS — Y92410 Unspecified street and highway as the place of occurrence of the external cause: Secondary | ICD-10-CM | POA: Insufficient documentation

## 2016-03-04 DIAGNOSIS — K0889 Other specified disorders of teeth and supporting structures: Secondary | ICD-10-CM

## 2016-03-04 DIAGNOSIS — S02609A Fracture of mandible, unspecified, initial encounter for closed fracture: Secondary | ICD-10-CM | POA: Insufficient documentation

## 2016-03-04 MED ORDER — OXYCODONE-ACETAMINOPHEN 5-325 MG PO TABS
2.0000 | ORAL_TABLET | Freq: Once | ORAL | Status: AC
  Administered 2016-03-04: 2 via ORAL
  Filled 2016-03-04: qty 2

## 2016-03-04 MED ORDER — OXYCODONE-ACETAMINOPHEN 5-325 MG PO TABS: 1.0000 | ORAL_TABLET | ORAL | 0 refills | Status: DC | PRN
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
## 2016-03-04 NOTE — Discharge Instructions (Signed)
Take the prescribed medication as directed for pain.  Use caution, it can make you sleepy/drowsy. Follow-up with Dr. Leta Baptist-- call his office to make appt. Return to the ED for new or worsening symptoms.
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
## 2016-03-04 NOTE — ED Notes (Signed)
Pt ambulated to the bathroom without assistance. 
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
## 2016-03-11 ENCOUNTER — Encounter (HOSPITAL_BASED_OUTPATIENT_CLINIC_OR_DEPARTMENT_OTHER): Payer: Self-pay | Admitting: *Deleted

## 2016-03-13 ENCOUNTER — Encounter (HOSPITAL_BASED_OUTPATIENT_CLINIC_OR_DEPARTMENT_OTHER): Payer: Self-pay

## 2016-03-13 ENCOUNTER — Ambulatory Visit (HOSPITAL_BASED_OUTPATIENT_CLINIC_OR_DEPARTMENT_OTHER): Payer: Self-pay | Admitting: Certified Registered"

## 2016-03-13 ENCOUNTER — Encounter (HOSPITAL_BASED_OUTPATIENT_CLINIC_OR_DEPARTMENT_OTHER)
Admission: RE | Disposition: A | Discharge status: Home / Self Care | Payer: Self-pay | Source: Ambulatory Visit | Attending: Plastic Surgery

## 2016-03-13 ENCOUNTER — Ambulatory Visit (HOSPITAL_BASED_OUTPATIENT_CLINIC_OR_DEPARTMENT_OTHER)
Admission: RE | Admit: 2016-03-13 | Discharge: 2016-03-13 | Disposition: A | Discharge status: Home / Self Care | Payer: Self-pay | Source: Ambulatory Visit | Attending: Plastic Surgery | Admitting: Plastic Surgery

## 2016-03-13 DIAGNOSIS — S02602B Fracture of unspecified part of body of left mandible, initial encounter for open fracture: Secondary | ICD-10-CM | POA: Insufficient documentation

## 2016-03-13 HISTORY — PX: ORIF MANDIBULAR FRACTURE: SHX2127

## 2016-03-13 SURGERY — OPEN REDUCTION INTERNAL FIXATION (ORIF) MANDIBULAR FRACTURE
Anesthesia: General | Site: Mouth | Laterality: Left

## 2016-03-13 MED ORDER — FENTANYL CITRATE (PF) 100 MCG/2ML IJ SOLN
INTRAMUSCULAR | Status: AC
  Filled 2016-03-13: qty 2

## 2016-03-13 MED ORDER — HYDRALAZINE HCL 20 MG/ML IJ SOLN
5.0000 mg | Freq: Once | INTRAMUSCULAR | Status: AC
  Administered 2016-03-13: 5 mg via INTRAVENOUS

## 2016-03-13 MED ORDER — LIDOCAINE-EPINEPHRINE 1 %-1:100000 IJ SOLN
INTRAMUSCULAR | Status: AC
  Filled 2016-03-13: qty 1

## 2016-03-13 MED ORDER — HYDRALAZINE HCL 20 MG/ML IJ SOLN
INTRAMUSCULAR | Status: AC
  Filled 2016-03-13: qty 1

## 2016-03-13 MED ORDER — LABETALOL HCL 5 MG/ML IV SOLN
INTRAVENOUS | Status: AC
  Filled 2016-03-13: qty 4

## 2016-03-13 MED ORDER — OXYCODONE HCL 5 MG/5ML PO SOLN
5.0000 mg | Freq: Once | ORAL | Status: AC | PRN
  Administered 2016-03-13: 5 mg via ORAL

## 2016-03-13 MED ORDER — FENTANYL CITRATE (PF) 100 MCG/2ML IJ SOLN
25.0000 ug | INTRAMUSCULAR | Status: DC | PRN
  Administered 2016-03-13 (×4): 50 ug via INTRAVENOUS

## 2016-03-13 MED ORDER — OXYCODONE-ACETAMINOPHEN 5-325 MG/5ML PO SOLN: 5.0000 mL | ORAL | 0 refills | Status: DC | PRN

## 2016-03-13 MED ORDER — PROPOFOL 10 MG/ML IV BOLUS
INTRAVENOUS | Status: AC
  Filled 2016-03-13: qty 20

## 2016-03-13 MED ORDER — DEXAMETHASONE SODIUM PHOSPHATE 4 MG/ML IJ SOLN
INTRAMUSCULAR | Status: DC | PRN
  Administered 2016-03-13: 10 mg via INTRAVENOUS

## 2016-03-13 MED ORDER — MIDAZOLAM HCL 2 MG/2ML IJ SOLN
INTRAMUSCULAR | Status: AC
  Filled 2016-03-13: qty 2

## 2016-03-13 MED ORDER — PROPOFOL 10 MG/ML IV BOLUS
INTRAVENOUS | Status: DC | PRN
  Administered 2016-03-13: 200 mg via INTRAVENOUS

## 2016-03-13 MED ORDER — BACITRACIN ZINC 500 UNIT/GM EX OINT
TOPICAL_OINTMENT | CUTANEOUS | Status: AC
  Filled 2016-03-13: qty 0.9

## 2016-03-13 MED ORDER — SCOPOLAMINE 1 MG/3DAYS TD PT72: 1.0000 | MEDICATED_PATCH | Freq: Once | TRANSDERMAL | Status: DC | PRN

## 2016-03-13 MED ORDER — LIDOCAINE 2% (20 MG/ML) 5 ML SYRINGE
INTRAMUSCULAR | Status: AC
  Filled 2016-03-13: qty 5

## 2016-03-13 MED ORDER — FENTANYL CITRATE (PF) 100 MCG/2ML IJ SOLN
50.0000 ug | INTRAMUSCULAR | Status: AC | PRN
  Administered 2016-03-13 (×3): 50 ug via INTRAVENOUS
  Administered 2016-03-13 (×4): 25 ug via INTRAVENOUS

## 2016-03-13 MED ORDER — CEFAZOLIN SODIUM-DEXTROSE 2-4 GM/100ML-% IV SOLN
2.0000 g | INTRAVENOUS | Status: AC
  Administered 2016-03-13: 2 g via INTRAVENOUS

## 2016-03-13 MED ORDER — LACTATED RINGERS IV SOLN
INTRAVENOUS | Status: DC
  Administered 2016-03-13 (×2): via INTRAVENOUS

## 2016-03-13 MED ORDER — LABETALOL HCL 5 MG/ML IV SOLN
5.0000 mg | Freq: Once | INTRAVENOUS | Status: AC
  Administered 2016-03-13: 5 mg via INTRAVENOUS

## 2016-03-13 MED ORDER — GLYCOPYRROLATE 0.2 MG/ML IJ SOLN: 0.2000 mg | Freq: Once | INTRAMUSCULAR | Status: DC | PRN

## 2016-03-13 MED ORDER — CEFAZOLIN SODIUM-DEXTROSE 2-4 GM/100ML-% IV SOLN
INTRAVENOUS | Status: AC
  Filled 2016-03-13: qty 100

## 2016-03-13 MED ORDER — LIDOCAINE-EPINEPHRINE 1 %-1:100000 IJ SOLN
INTRAMUSCULAR | Status: DC | PRN
  Administered 2016-03-13: 3 mL

## 2016-03-13 MED ORDER — OXYCODONE HCL 5 MG/5ML PO SOLN
ORAL | Status: AC
  Filled 2016-03-13: qty 5

## 2016-03-13 MED ORDER — BACITRACIN ZINC 500 UNIT/GM EX OINT
TOPICAL_OINTMENT | CUTANEOUS | Status: AC
  Filled 2016-03-13: qty 28.35

## 2016-03-13 MED ORDER — MIDAZOLAM HCL 2 MG/2ML IJ SOLN
1.0000 mg | INTRAMUSCULAR | Status: DC | PRN
  Administered 2016-03-13: 2 mg via INTRAVENOUS

## 2016-03-13 MED ORDER — PROMETHAZINE HCL 25 MG/ML IJ SOLN: 6.2500 mg | INTRAMUSCULAR | Status: DC | PRN

## 2016-03-13 MED ORDER — LIDOCAINE 2% (20 MG/ML) 5 ML SYRINGE
INTRAMUSCULAR | Status: DC | PRN
  Administered 2016-03-13: 60 mg via INTRAVENOUS

## 2016-03-13 MED ORDER — SUCCINYLCHOLINE CHLORIDE 20 MG/ML IJ SOLN
INTRAMUSCULAR | Status: DC | PRN
  Administered 2016-03-13: 100 mg via INTRAVENOUS

## 2016-03-13 MED ORDER — ONDANSETRON HCL 4 MG/2ML IJ SOLN
INTRAMUSCULAR | Status: DC | PRN
  Administered 2016-03-13 (×2): 4 mg via INTRAVENOUS

## 2016-03-13 MED ORDER — OXYMETAZOLINE HCL 0.05 % NA SOLN
NASAL | Status: AC
  Filled 2016-03-13: qty 15

## 2016-03-13 MED ORDER — DEXAMETHASONE SODIUM PHOSPHATE 10 MG/ML IJ SOLN
INTRAMUSCULAR | Status: AC
  Filled 2016-03-13: qty 1

## 2016-03-13 SURGICAL SUPPLY — 48 items
BLADE CLIPPER SURG (BLADE) IMPLANT
BLADE SURG 15 STRL LF DISP TIS (BLADE) ×1 IMPLANT
BLADE SURG 15 STRL SS (BLADE) ×1
CANISTER SUCTION 2500CC (MISCELLANEOUS) ×2 IMPLANT
COVER BACK TABLE 60X90IN (DRAPES) ×2 IMPLANT
COVER MAYO STAND STRL (DRAPES) ×2 IMPLANT
DECANTER SPIKE VIAL GLASS SM (MISCELLANEOUS) IMPLANT
DRAPE U-SHAPE 76X120 STRL (DRAPES) IMPLANT
ELECT COATED BLADE 2.86 ST (ELECTRODE) IMPLANT
ELECT REM PT RETURN 9FT ADLT (ELECTROSURGICAL)
ELECTRODE REM PT RTRN 9FT ADLT (ELECTROSURGICAL) IMPLANT
GLOVE BIO SURGEON STRL SZ 6 (GLOVE) ×2 IMPLANT
GOWN STRL REUS W/ TWL LRG LVL3 (GOWN DISPOSABLE) ×2 IMPLANT
GOWN STRL REUS W/TWL LRG LVL3 (GOWN DISPOSABLE) ×2
KIT ROOM TURNOVER OR (KITS) ×2 IMPLANT
NEEDLE BLUNT 17GA (NEEDLE) IMPLANT
NEEDLE PRECISIONGLIDE 27X1.5 (NEEDLE) ×2 IMPLANT
NS IRRIG 1000ML POUR BTL (IV SOLUTION) ×2 IMPLANT
PACK BASIN DAY SURGERY FS (CUSTOM PROCEDURE TRAY) ×2 IMPLANT
PENCIL BUTTON HOLSTER BLD 10FT (ELECTRODE) IMPLANT
PLATE HYBRID GOLD MMF (Plate) ×2 IMPLANT
PLATE HYBRID MMF GOLD (Plate) ×2 IMPLANT
SCISSORS WIRE ANG 4 3/4 DISP (INSTRUMENTS) ×2 IMPLANT
SCREW LOCK SELF DRILL 8MM (Screw) ×18 IMPLANT
SLEEVE SCD COMPRESS KNEE MED (MISCELLANEOUS) IMPLANT
SUT ETHILON 4 0 CL P 3 (SUTURE) IMPLANT
SUT MON AB 3-0 SH 27 (SUTURE)
SUT MON AB 3-0 SH27 (SUTURE) IMPLANT
SUT PROLENE 6 0 PC 1 (SUTURE) IMPLANT
SUT SILK 2 0 FS (SUTURE) IMPLANT
SUT STEEL 0 (SUTURE)
SUT STEEL 0 18XMFL TIE 17 (SUTURE) IMPLANT
SUT STEEL 1 (SUTURE) IMPLANT
SUT STEEL 2 (SUTURE) ×8 IMPLANT
SUT STEEL 4 (SUTURE) IMPLANT
SUT VIC AB 4-0 RB1 27 (SUTURE)
SUT VIC AB 4-0 RB1 27X BRD (SUTURE) IMPLANT
SUT VIC AB 4-0 SH 27 (SUTURE)
SUT VIC AB 4-0 SH 27XANBCTRL (SUTURE) IMPLANT
SYR 50ML LL SCALE MARK (SYRINGE) ×2 IMPLANT
SYR CONTROL 10ML LL (SYRINGE) ×2 IMPLANT
TOOTHBRUSH ADULT (PERSONAL CARE ITEMS) IMPLANT
TOWEL OR 17X24 6PK STRL BLUE (TOWEL DISPOSABLE) ×2 IMPLANT
TRAY DSU PREP LF (CUSTOM PROCEDURE TRAY) IMPLANT
TUBE CONNECTING 20X1/4 (TUBING) ×2 IMPLANT
TUBE SALEM SUMP 12R W/ARV (TUBING) IMPLANT
TUBE SALEM SUMP 16 FR W/ARV (TUBING) IMPLANT
YANKAUER SUCT BULB TIP NO VENT (SUCTIONS) ×2 IMPLANT

## 2016-03-13 NOTE — Anesthesia Procedure Notes (Signed)
Procedure Name: Intubation Date/Time: 03/13/2016 1:53 PM Performed by: Gar Gibbon Pre-anesthesia Checklist: Patient identified, Emergency Drugs available, Suction available and Patient being monitored Patient Re-evaluated:Patient Re-evaluated prior to inductionOxygen Delivery Method: Circle system utilized Preoxygenation: Pre-oxygenation with 100% oxygen Intubation Type: IV induction Ventilation: Mask ventilation without difficulty Laryngoscope Size: Miller and 3 Grade View: Grade III Nasal Tubes: Nasal prep performed, Nasal Rae, Left and Magill forceps- large, utilized Tube size: 7.5 mm Number of attempts: 1 Airway Equipment and Method: Patient positioned with wedge pillow Placement Confirmation: ETT inserted through vocal cords under direct vision,  positive ETCO2 and breath sounds checked- equal and bilateral Secured at: 29 cm Tube secured with: Tape Dental Injury: Teeth and Oropharynx as per pre-operative assessment

## 2016-03-13 NOTE — Anesthesia Postprocedure Evaluation (Signed)
Anesthesia Post Note  Patient: Scott Conway  Procedure(s) Performed: Procedure(s) (LRB): OPEN REDUCTION INTERNAL FIXATION (ORIF) MANDIBULAR MAXILLARY FRACTURE (Left)  Patient location during evaluation: PACU Anesthesia Type: General Level of consciousness: awake and alert Pain management: pain level controlled Vital Signs Assessment: post-procedure vital signs reviewed and stable Respiratory status: spontaneous breathing, nonlabored ventilation and respiratory function stable Cardiovascular status: blood pressure returned to baseline and stable Postop Assessment: no signs of nausea or vomiting Anesthetic complications: no    Last Vitals:  Vitals:   03/13/16 1645 03/13/16 1715  BP:  (!) 169/97  Pulse: 79 81  Resp: 10 16  Temp:  37 C    Last Pain:  Vitals:   03/13/16 1715  TempSrc:   PainSc: 3                  Kennieth Rad

## 2016-03-13 NOTE — Interval H&P Note (Signed)
History and Physical Interval Note:  03/13/2016 1:15 PM  Scott Conway  has presented today for surgery, with the diagnosis of left mandible body fracture open  The various methods of treatment have been discussed with the patient and family. After consideration of risks, benefits and other options for treatment, the patient has consented to  Intermaxillary fixation as a surgical intervention .  The patient's history has been reviewed, patient examined, no change in status, stable for surgery.  I have reviewed the patient's chart and labs.  Questions were answered to the patient's satisfaction.     Scott Conway

## 2016-03-13 NOTE — Transfer of Care (Signed)
Immediate Anesthesia Transfer of Care Note  Patient: Scott Conway  Procedure(s) Performed: Procedure(s) with comments: OPEN REDUCTION INTERNAL FIXATION (ORIF) MANDIBULAR MAXILLARY FRACTURE (Left) - OPEN REDUCTION INTERNAL FIXATION (ORIF) MANDIBULAR MAXILLARY FRACTURE  Patient Location: PACU  Anesthesia Type:General  Level of Consciousness: awake, sedated and patient cooperative  Airway & Oxygen Therapy: Patient Spontanous Breathing and Patient connected to face mask oxygen  Post-op Assessment: Report given to RN and Post -op Vital signs reviewed and stable  Post vital signs: Reviewed and stable  Last Vitals:  Vitals:   03/13/16 1255  BP: 122/65  Pulse: 73  Resp: 18  Temp: 36.9 C    Last Pain:  Vitals:   03/13/16 1255  TempSrc: Oral  PainSc: 4          Complications: No apparent anesthesia complications

## 2016-03-13 NOTE — Discharge Instructions (Signed)

## 2016-03-13 NOTE — Op Note (Signed)
Operative Note   DATE OF OPERATION: 8.4.17  LOCATION: [facility]-outpatient  SURGICAL DIVISION: Plastic Surgery  PREOPERATIVE DIAGNOSES:  1. Open fracture left mandible body  POSTOPERATIVE DIAGNOSES:  same  PROCEDURE:  Intermaxillary fixation  SURGEON: Glenna Fellows MD MBA  ASSISTANT: none  ANESTHESIA:  General.   EBL: 40 ml  COMPLICATIONS: None immediate.   INDICATIONS FOR PROCEDURE:  The patient, Scott Conway, is a 50 y.o. male born on [birth year], is here for treatment mandibular fracture. Patient declined internal fixation.   FINDINGS: Left mandibular body fracture that extended to anterior 1st molar.  DESCRIPTION OF PROCEDURE:  The patient's operative site was marked with the patient in the preoperative area. The patient was taken to the operating room. SCDs were placed and IV antibiotics were given. Nasotracheal intubation completed per anesthesia and tube secured to membranous septum with 2-0 silk. The patient's operative site was prepped and draped in usual fashion. A time out was performed and all information was confirmed to be correct. Local anesthetic infiltrated to perform bilateral intraorbital and mental nerve blocks. Stryker Hybrid intermaxillary system utilized and arch bars secured with 8 mm screws to alveolar bone. 24 Ga wire used to bing patient into occlusion. Nasogastric tube placed and stomach contents suctioned.  The patient was allowed to wake from anesthesia, extubated and taken to the recovery room in satisfactory condition.   SPECIMENS: none  DRAINS: none  Glenna Fellows, MD [facility] Plastic & Reconstructive Surgery [phone], pin [phone]

## 2016-03-13 NOTE — Anesthesia Preprocedure Evaluation (Addendum)
Anesthesia Evaluation  Patient identified by MRN, date of birth, ID band Patient awake    Reviewed: Allergy & Precautions, NPO status , Patient's Chart, lab work & pertinent test results  Airway Mallampati: III  TM Distance: >3 FB Neck ROM: Full    Dental  (+) Dental Advisory Given   Pulmonary neg pulmonary ROS,    breath sounds clear to auscultation       Cardiovascular negative cardio ROS   Rhythm:Regular Rate:Normal     Neuro/Psych negative neurological ROS     GI/Hepatic negative GI ROS, Neg liver ROS,   Endo/Other  negative endocrine ROS  Renal/GU negative Renal ROS     Musculoskeletal   Abdominal   Peds  Hematology negative hematology ROS (+)   Anesthesia Other Findings   Reproductive/Obstetrics                            Anesthesia Physical Anesthesia Plan  ASA: I  Anesthesia Plan: General   Post-op Pain Management:    Induction: Intravenous  Airway Management Planned: Nasal ETT  Additional Equipment:   Intra-op Plan:   Post-operative Plan: Extubation in OR  Informed Consent: I have reviewed the patients History and Physical, chart, labs and discussed the procedure including the risks, benefits and alternatives for the proposed anesthesia with the patient or authorized representative who has indicated his/her understanding and acceptance.   Dental advisory given  Plan Discussed with: CRNA  Anesthesia Plan Comments:         Anesthesia Quick Evaluation

## 2016-03-16 ENCOUNTER — Encounter (HOSPITAL_BASED_OUTPATIENT_CLINIC_OR_DEPARTMENT_OTHER): Payer: Self-pay | Admitting: Plastic Surgery

## 2016-04-07 ENCOUNTER — Encounter (HOSPITAL_BASED_OUTPATIENT_CLINIC_OR_DEPARTMENT_OTHER): Payer: Self-pay | Admitting: *Deleted

## 2016-04-09 NOTE — H&P (Signed)
  Subjective:    Patient ID: Scott Conway is a 50 y.o. male.  HPI  3.5 weeks post op IMF for treatment left mandibular body fracture DOI 7.25.17. C/o feeing tired all the time, affecting work as states takes him much longer to work. Feels tongue gets swollen with talking due to irritation of wires.   Patient elected for IMF over internal fixation as did not desire permanent prosthetic material.  Review of Systems     Objective:   Physical Exam  Cardiovascular: Normal rate, regular rhythm and normal heart sounds.   Pulmonary/Chest: Effort normal and breath sounds normal.    HEENT: fixation intact with incisors end on, slightly open bite at molars- per patient this is his premorbid bite, unchanged, per patient minimal to no tenderness to palpation over left mandibular body     Assessment:     Left mandibular body fracture s/p IMF    Plan:     Plan removal IMF in 1-2 weeks. Reviewed he will have stiffness post removal and will take few weeks to return to full ROM.  Scott FellowsBrinda Kamilla Hands, MD [facility] Plastic [facility] Re[facility]ive Surgery [phone], pin [phone]

## 2016-04-14 ENCOUNTER — Ambulatory Visit (HOSPITAL_BASED_OUTPATIENT_CLINIC_OR_DEPARTMENT_OTHER): Payer: Self-pay | Admitting: Anesthesiology

## 2016-04-14 ENCOUNTER — Encounter (HOSPITAL_BASED_OUTPATIENT_CLINIC_OR_DEPARTMENT_OTHER)
Admission: RE | Disposition: A | Discharge status: Home / Self Care | Payer: Self-pay | Source: Ambulatory Visit | Attending: Plastic Surgery

## 2016-04-14 ENCOUNTER — Encounter (HOSPITAL_BASED_OUTPATIENT_CLINIC_OR_DEPARTMENT_OTHER): Payer: Self-pay

## 2016-04-14 ENCOUNTER — Ambulatory Visit (HOSPITAL_BASED_OUTPATIENT_CLINIC_OR_DEPARTMENT_OTHER)
Admission: RE | Admit: 2016-04-14 | Discharge: 2016-04-14 | Disposition: A | Discharge status: Home / Self Care | Payer: Self-pay | Source: Ambulatory Visit | Attending: Plastic Surgery | Admitting: Plastic Surgery

## 2016-04-14 DIAGNOSIS — X58XXXD Exposure to other specified factors, subsequent encounter: Secondary | ICD-10-CM | POA: Insufficient documentation

## 2016-04-14 DIAGNOSIS — S02602D Fracture of unspecified part of body of left mandible, subsequent encounter for fracture with routine healing: Secondary | ICD-10-CM | POA: Insufficient documentation

## 2016-04-14 HISTORY — PX: MANDIBULAR HARDWARE REMOVAL: SHX5205

## 2016-04-14 SURGERY — REMOVAL, HARDWARE, MANDIBLE
Anesthesia: Monitor Anesthesia Care | Site: Mouth

## 2016-04-14 MED ORDER — LIDOCAINE 2% (20 MG/ML) 5 ML SYRINGE
INTRAMUSCULAR | Status: AC
  Filled 2016-04-14: qty 5

## 2016-04-14 MED ORDER — DEXAMETHASONE SODIUM PHOSPHATE 10 MG/ML IJ SOLN
INTRAMUSCULAR | Status: AC
  Filled 2016-04-14: qty 1

## 2016-04-14 MED ORDER — BACITRACIN-NEOMYCIN-POLYMYXIN 400-5-5000 EX OINT
TOPICAL_OINTMENT | CUTANEOUS | Status: DC | PRN
  Administered 2016-04-14: 1 via TOPICAL

## 2016-04-14 MED ORDER — MIDAZOLAM HCL 2 MG/2ML IJ SOLN: 1.0000 mg | INTRAMUSCULAR | Status: DC | PRN

## 2016-04-14 MED ORDER — ONDANSETRON HCL 4 MG/2ML IJ SOLN
INTRAMUSCULAR | Status: AC
  Filled 2016-04-14: qty 2

## 2016-04-14 MED ORDER — KETOROLAC TROMETHAMINE 30 MG/ML IJ SOLN
INTRAMUSCULAR | Status: AC
  Filled 2016-04-14: qty 1

## 2016-04-14 MED ORDER — FENTANYL CITRATE (PF) 100 MCG/2ML IJ SOLN
INTRAMUSCULAR | Status: DC | PRN
  Administered 2016-04-14 (×2): 50 ug via INTRAVENOUS

## 2016-04-14 MED ORDER — BACITRACIN-NEOMYCIN-POLYMYXIN 400-5-5000 EX OINT
TOPICAL_OINTMENT | CUTANEOUS | Status: AC
  Filled 2016-04-14: qty 1

## 2016-04-14 MED ORDER — FENTANYL CITRATE (PF) 100 MCG/2ML IJ SOLN: 50.0000 ug | INTRAMUSCULAR | Status: DC | PRN

## 2016-04-14 MED ORDER — HYDROMORPHONE HCL 1 MG/ML IJ SOLN: 0.2500 mg | INTRAMUSCULAR | Status: DC | PRN

## 2016-04-14 MED ORDER — CEFAZOLIN SODIUM-DEXTROSE 2-4 GM/100ML-% IV SOLN
INTRAVENOUS | Status: AC
  Filled 2016-04-14: qty 100

## 2016-04-14 MED ORDER — KETOROLAC TROMETHAMINE 30 MG/ML IJ SOLN
INTRAMUSCULAR | Status: DC | PRN
  Administered 2016-04-14: 30 mg via INTRAVENOUS

## 2016-04-14 MED ORDER — CEFAZOLIN SODIUM-DEXTROSE 2-4 GM/100ML-% IV SOLN
2.0000 g | INTRAVENOUS | Status: AC
  Administered 2016-04-14: 2 g via INTRAVENOUS

## 2016-04-14 MED ORDER — PROPOFOL 10 MG/ML IV BOLUS
INTRAVENOUS | Status: AC
  Filled 2016-04-14: qty 20

## 2016-04-14 MED ORDER — GLYCOPYRROLATE 0.2 MG/ML IJ SOLN: 0.2000 mg | Freq: Once | INTRAMUSCULAR | Status: DC | PRN

## 2016-04-14 MED ORDER — LACTATED RINGERS IV SOLN
INTRAVENOUS | Status: DC
  Administered 2016-04-14: 06:00:00 via INTRAVENOUS

## 2016-04-14 MED ORDER — MEPERIDINE HCL 25 MG/ML IJ SOLN: 6.2500 mg | INTRAMUSCULAR | Status: DC | PRN

## 2016-04-14 MED ORDER — MIDAZOLAM HCL 5 MG/5ML IJ SOLN
INTRAMUSCULAR | Status: DC | PRN
Start: 2016-04-14 — End: 2016-04-14
  Administered 2016-04-14 (×2): 1 mg via INTRAVENOUS

## 2016-04-14 MED ORDER — FENTANYL CITRATE (PF) 100 MCG/2ML IJ SOLN
INTRAMUSCULAR | Status: AC
  Filled 2016-04-14: qty 2

## 2016-04-14 MED ORDER — LACTATED RINGERS IV SOLN: INTRAVENOUS | Status: DC

## 2016-04-14 MED ORDER — SCOPOLAMINE 1 MG/3DAYS TD PT72: 1.0000 | MEDICATED_PATCH | Freq: Once | TRANSDERMAL | Status: DC | PRN

## 2016-04-14 MED ORDER — PROMETHAZINE HCL 25 MG/ML IJ SOLN: 6.2500 mg | INTRAMUSCULAR | Status: DC | PRN

## 2016-04-14 MED ORDER — LIDOCAINE-EPINEPHRINE 1 %-1:100000 IJ SOLN
INTRAMUSCULAR | Status: DC | PRN
  Administered 2016-04-14: 5.5 mL

## 2016-04-14 MED ORDER — LIDOCAINE-EPINEPHRINE (PF) 1 %-1:200000 IJ SOLN
INTRAMUSCULAR | Status: AC
  Filled 2016-04-14: qty 30

## 2016-04-14 MED ORDER — MIDAZOLAM HCL 2 MG/2ML IJ SOLN
INTRAMUSCULAR | Status: AC
  Filled 2016-04-14: qty 2

## 2016-04-14 SURGICAL SUPPLY — 29 items
BLADE SURG 15 STRL LF DISP TIS (BLADE) ×1 IMPLANT
BLADE SURG 15 STRL SS (BLADE) ×1
CANISTER SUCT 1200ML W/VALVE (MISCELLANEOUS) ×2 IMPLANT
COVER MAYO STAND STRL (DRAPES) IMPLANT
DECANTER SPIKE VIAL GLASS SM (MISCELLANEOUS) IMPLANT
ELECT COATED BLADE 2.86 ST (ELECTRODE) IMPLANT
ELECT REM PT RETURN 9FT ADLT (ELECTROSURGICAL) ×2
ELECTRODE REM PT RTRN 9FT ADLT (ELECTROSURGICAL) ×1 IMPLANT
GLOVE BIO SURGEON STRL SZ 6 (GLOVE) ×2 IMPLANT
GLOVE BIOGEL PI IND STRL 7.0 (GLOVE) ×1 IMPLANT
GLOVE BIOGEL PI INDICATOR 7.0 (GLOVE) ×1
GOWN STRL REUS W/ TWL LRG LVL3 (GOWN DISPOSABLE) ×1 IMPLANT
GOWN STRL REUS W/TWL LRG LVL3 (GOWN DISPOSABLE) ×1
MARKER SKIN DUAL TIP RULER LAB (MISCELLANEOUS) IMPLANT
NEEDLE PRECISIONGLIDE 27X1.5 (NEEDLE) ×2 IMPLANT
PACK BASIN DAY SURGERY FS (CUSTOM PROCEDURE TRAY) ×2 IMPLANT
PENCIL BUTTON HOLSTER BLD 10FT (ELECTRODE) ×2 IMPLANT
PENCIL FOOT CONTROL (ELECTRODE) IMPLANT
SHEET MEDIUM DRAPE 40X70 STRL (DRAPES) IMPLANT
SUCTION FRAZIER HANDLE 10FR (MISCELLANEOUS) ×1
SUCTION TUBE FRAZIER 10FR DISP (MISCELLANEOUS) ×1 IMPLANT
SUT CHROMIC 3 0 PS 2 (SUTURE) IMPLANT
SUT CHROMIC 4 0 PS 2 18 (SUTURE) IMPLANT
SUT CHROMIC 5 0 RB 1 27 (SUTURE) ×2 IMPLANT
SYR CONTROL 10ML LL (SYRINGE) ×2 IMPLANT
TOWEL OR 17X24 6PK STRL BLUE (TOWEL DISPOSABLE) ×2 IMPLANT
TRAY DSU PREP LF (CUSTOM PROCEDURE TRAY) IMPLANT
TUBE CONNECTING 20X1/4 (TUBING) ×2 IMPLANT
YANKAUER SUCT BULB TIP NO VENT (SUCTIONS) ×2 IMPLANT

## 2016-04-14 NOTE — Anesthesia Postprocedure Evaluation (Signed)
Anesthesia Post Note  Patient: Scott SchmidtDavid Mallick  Procedure(s) Performed: Procedure(s) (LRB): REMOVAL OF INTERMAXILLARY FIXATION (N/A)  Patient location during evaluation: PACU Anesthesia Type: MAC Level of consciousness: awake and alert Pain management: pain level controlled Vital Signs Assessment: post-procedure vital signs reviewed and stable Respiratory status: spontaneous breathing, nonlabored ventilation, respiratory function stable and patient connected to nasal cannula oxygen Cardiovascular status: stable and blood pressure returned to baseline Anesthetic complications: no    Last Vitals:  Vitals:   04/14/16 0800 04/14/16 0815  BP: 123/88 115/69  Pulse: (!) 58 (!) 54  Resp: 14 14  Temp:      Last Pain:  Vitals:   04/14/16 0815  TempSrc:   PainSc: 2                  Shelton SilvasKevin D Alekzander Cardell

## 2016-04-14 NOTE — Transfer of Care (Signed)
2Immediate Anesthesia Transfer of Care Note  Patient: Scott SchmidtDavid Conway  Procedure(s) Performed: Procedure(s): REMOVAL OF INTERMAXILLARY FIXATION (N/A)  Patient Location: PACU  Anesthesia Type:MAC  Level of Consciousness: awake and patient cooperative  Airway & Oxygen Therapy: Patient Spontanous Breathing and Patient connected to nasal cannula oxygen  Post-op Assessment: Report given to RN and Post -op Vital signs reviewed and stable  Post vital signs: Reviewed and stable  Last Vitals:  Vitals:   04/14/16 0651 04/14/16 0747  BP: 107/67 125/80  Pulse: (!) 55   Resp: 18   Temp: 36.4 C     Last Pain:  Vitals:   04/14/16 0651  TempSrc: Axillary         Complications: No apparent anesthesia complications

## 2016-04-14 NOTE — Discharge Instructions (Signed)

## 2016-04-14 NOTE — Anesthesia Preprocedure Evaluation (Addendum)
Anesthesia Evaluation  Patient identified by MRN, date of birth, ID band Patient awake    Reviewed: Allergy & Precautions, NPO status , Patient's Chart, lab work & pertinent test results  Airway      Mouth opening: Limited Mouth Opening  Dental  (+) Teeth Intact   Pulmonary neg pulmonary ROS,    breath sounds clear to auscultation       Cardiovascular negative cardio ROS   Rhythm:Regular Rate:Normal     Neuro/Psych negative neurological ROS  negative psych ROS   GI/Hepatic Neg liver ROS,   Endo/Other  negative endocrine ROS  Renal/GU negative Renal ROS  negative genitourinary   Musculoskeletal negative musculoskeletal ROS (+)   Abdominal   Peds negative pediatric ROS (+)  Hematology negative hematology ROS (+)   Anesthesia Other Findings   Reproductive/Obstetrics negative OB ROS                            Lab Results  Component Value Date   WBC 7.4 06/09/2015   HGB 16.7 06/09/2015   HCT 49.0 06/09/2015   MCV 90.4 06/09/2015   PLT 183 06/09/2015   Lab Results  Component Value Date   CREATININE 1.26 (H) 06/10/2015   BUN 16 06/10/2015   NA 138 06/10/2015   K 3.7 06/10/2015   CL 108 06/10/2015   CO2 25 06/10/2015   Lab Results  Component Value Date   INR 1.08 06/09/2015   INR 1.03 06/09/2015   05/2015 EKG: normal sinus rhythm.  05/2015 Echo - Left ventricle: The cavity size was normal. Wall thickness was   increased in a pattern of moderate LVH. Systolic function was   normal. The estimated ejection fraction was in the range of 60%   to 65%. Wall motion was normal; there were no regional wall   motion abnormalities. Features are consistent with a pseudonormal   left ventricular filling pattern, with concomitant abnormal   relaxation and increased filling pressure (grade 2 diastolic   dysfunction). - Aortic valve: There was mild to moderate regurgitation.  Anesthesia  Physical Anesthesia Plan  ASA: II  Anesthesia Plan: MAC   Post-op Pain Management:    Induction: Inhalational  Airway Management Planned: Mask  Additional Equipment:   Intra-op Plan:   Post-operative Plan:   Informed Consent: I have reviewed the patients History and Physical, chart, labs and discussed the procedure including the risks, benefits and alternatives for the proposed anesthesia with the patient or authorized representative who has indicated his/her understanding and acceptance.     Plan Discussed with: CRNA  Anesthesia Plan Comments:         Anesthesia Quick Evaluation

## 2016-04-14 NOTE — Op Note (Signed)
Operative Note   DATE OF OPERATION: 9.5.17  [facility]  SURGICAL DIVISION: Plastic Surgery  PREOPERATIVE DIAGNOSES:  Open fracture left mandible body  POSTOPERATIVE DIAGNOSES:  same  PROCEDURE:  Removal of superficial hardware (intermaxillary fixation)  SURGEON: Glenna FellowsBrinda Ramiro Pangilinan MD MBA  ASSISTANT: none  ANESTHESIA:  MAC.   EBL: minimal  COMPLICATIONS: None immediate.   INDICATIONS FOR PROCEDURE:  The patient, Scott Conway, is a 50 y.o. male born on [birth year], is here for removal intermaxillary fixation used for treatment mandibular body fracture.   FINDINGS: Occlusion with incisors end on. No gross instability of mandible.  DESCRIPTION OF PROCEDURE:  The patient was taken to the operating room. SCDs were placed and IV antibiotics were given. A time out was performed and all information was confirmed to be correct.  After achieving adequate sedation, local anesthetic infiltrated to perform bilateral mental and infraorbital nerve blocks. Intermaxillary wires cut and removed. Total eight screws from maxilla and mandible removed and hybrid arch bars removed. In areas where soft tissue had grown over screws and incisions made in gingiva, the gingiva approximated with 5-0 chromic. Mouth irrigated.   The patient was allowed to wake from anesthesia, extubated and taken to the recovery room in satisfactory condition.   SPECIMENS: none  DRAINS: none  Glenna FellowsBrinda Bettie Capistran, MD [facility] [phone], pin [phone]

## 2016-04-14 NOTE — Interval H&P Note (Signed)
History and Physical Interval Note:  04/14/2016 6:59 AM  Scott Conway  has presented today for surgery, with the diagnosis of open fracture of left mandible  The various methods of treatment have been discussed with the patient and family. After consideration of risks, benefits and other options for treatment, the patient has consented to  Procedure(s): REMOVAL OF INTERMAXILLARY FIXATION (Left) as a surgical intervention .  The patient's history has been reviewed, patient examined, no change in status, stable for surgery.  I have reviewed the patient's chart and labs.  Questions were answered to the patient's satisfaction.     Jillianna Stanek

## 2016-04-15 ENCOUNTER — Encounter (HOSPITAL_BASED_OUTPATIENT_CLINIC_OR_DEPARTMENT_OTHER): Payer: Self-pay | Admitting: Plastic Surgery

## 2018-04-23 ENCOUNTER — Other Ambulatory Visit: Payer: Self-pay

## 2018-04-23 ENCOUNTER — Emergency Department (HOSPITAL_COMMUNITY)
Admission: EM | Admit: 2018-04-23 | Discharge: 2018-04-24 | Disposition: A | Discharge status: Home / Self Care | Payer: Self-pay | Attending: Emergency Medicine | Admitting: Emergency Medicine

## 2018-04-23 DIAGNOSIS — N201 Calculus of ureter: Secondary | ICD-10-CM | POA: Insufficient documentation

## 2018-04-23 DIAGNOSIS — Z79899 Other long term (current) drug therapy: Secondary | ICD-10-CM | POA: Insufficient documentation

## 2018-04-23 LAB — CBC
HCT: 45.3 % (ref 39.0–52.0)
HEMOGLOBIN: 15.2 g/dL (ref 13.0–17.0)
MCH: 30.7 pg (ref 26.0–34.0)
MCHC: 33.6 g/dL (ref 30.0–36.0)
MCV: 91.5 fL (ref 78.0–100.0)
PLATELETS: 549 10*3/uL — AB (ref 150–400)
RBC: 4.95 MIL/uL (ref 4.22–5.81)
RDW: 13.2 % (ref 11.5–15.5)
WBC: 7 10*3/uL (ref 4.0–10.5)

## 2018-04-23 LAB — COMPREHENSIVE METABOLIC PANEL
ALBUMIN: 3.6 g/dL (ref 3.5–5.0)
ALK PHOS: 68 U/L (ref 38–126)
ALT: 74 U/L — AB (ref 0–44)
AST: 35 U/L (ref 15–41)
Anion gap: 11 (ref 5–15)
BUN: 21 mg/dL — ABNORMAL HIGH (ref 6–20)
CHLORIDE: 100 mmol/L (ref 98–111)
CO2: 24 mmol/L (ref 22–32)
CREATININE: 1.29 mg/dL — AB (ref 0.61–1.24)
Calcium: 9.4 mg/dL (ref 8.9–10.3)
GFR calc non Af Amer: >60 mL/min (ref >60)
GLUCOSE: 113 mg/dL — AB (ref 70–99)
Potassium: 4.2 mmol/L (ref 3.5–5.1)
SODIUM: 135 mmol/L (ref 135–145)
Total Bilirubin: 0.6 mg/dL (ref 0.3–1.2)
Total Protein: 7 g/dL (ref 6.5–8.1)

## 2018-04-23 LAB — URINALYSIS, ROUTINE W REFLEX MICROSCOPIC
Bacteria, UA: NONE SEEN
Bilirubin Urine: NEGATIVE
Glucose, UA: NEGATIVE mg/dL
Ketones, ur: NEGATIVE mg/dL
Leukocytes, UA: NEGATIVE
Nitrite: NEGATIVE
PROTEIN: NEGATIVE mg/dL
SPECIFIC GRAVITY, URINE: 1.019 (ref 1.005–1.030)
pH: 5 (ref 5.0–8.0)

## 2018-04-23 LAB — LIPASE, BLOOD: LIPASE: 36 U/L (ref 11–51)

## 2018-04-23 MED ORDER — FENTANYL CITRATE (PF) 100 MCG/2ML IJ SOLN
50.0000 ug | INTRAMUSCULAR | Status: DC | PRN
  Administered 2018-04-23: 50 ug via INTRAVENOUS
  Filled 2018-04-23: qty 2

## 2018-04-23 NOTE — ED Triage Notes (Addendum)
Patient c/o left lower abdominal pain that began last night. States that [place]naires and believes that he is having a reaction to the abx (these were completed yesterday). Patient is writhing in pain in triage.

## 2018-04-24 ENCOUNTER — Emergency Department (HOSPITAL_COMMUNITY): Payer: Self-pay

## 2018-04-24 MED ORDER — TAMSULOSIN HCL 0.4 MG PO CAPS: 0.4000 mg | ORAL_CAPSULE | Freq: Every day | ORAL | 0 refills | Status: AC

## 2018-04-24 MED ORDER — HYDROCODONE-ACETAMINOPHEN 5-325 MG PO TABS: 1.0000 | ORAL_TABLET | Freq: Four times a day (QID) | ORAL | 0 refills | Status: AC | PRN

## 2018-04-24 NOTE — Discharge Instructions (Addendum)
Use urinary strainer to confirm when you pass the stone. Return here if you develop a fever, have uncontrolled pain or new concern. It is recommended that you get a primary care provider for both routine medical concerns and for any needed/recommended follow up after Legionaires infection.

## 2018-04-24 NOTE — ED Provider Notes (Signed)
[facility] Provider Note   CSN: [id number] Arrival date & time: 04/23/18  2019     History   Chief Complaint Chief Complaint  Patient presents with  . Abdominal Pain    HPI Scott Conway is a 52 y.o. male.  Patient here with nonradiating LLQ abdominal pain that started during the night last night. He states since onset the pain has been constant and progressive. On arrival the pain was "unbearable" and he was given Fentanyl with significant relief. No nausea or vomiting. No fever. He denies hematuria or dysuria. He has a history of kidney stones and states this pain was much different. His last bowel movement was around noon today and did not affect the character of the pain at all. No flank pain, melena or blood in his stool.    The history is provided by the patient. No language interpreter was used.  Abdominal Pain   Pertinent negatives include fever, nausea, vomiting and hematuria.    No past medical history on file.  Patient Active Problem List   Diagnosis Date Noted  . 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 Fall   . Loss of consciousness (HCC)   . Syncope 06/09/2015  . LOC (loss of consciousness) (HCC) 06/09/2015    Past Surgical History:  Procedure Laterality Date  . ANKLE ARTHROSCOPY     removal of bone spur  . KNEE ARTHROSCOPY    . MANDIBULAR HARDWARE REMOVAL N/A 04/14/2016   Procedure: REMOVAL OF INTERMAXILLARY FIXATION;  Surgeon: Glenna FellowsBrinda Thimmappa, MD;  Location: [facility];  Service: Plastics;  Laterality: N/A;  . ORIF MANDIBULAR FRACTURE Left 03/13/2016   Procedure: OPEN REDUCTION INTERNAL FIXATION (ORIF) MANDIBULAR MAXILLARY FRACTURE;  Surgeon: Glenna FellowsBrinda Thimmappa, MD;  Location: [facility];  Service: Plastics;  Laterality: Left;  OPEN REDUCTION INTERNAL FIXATION (ORIF) MANDIBULAR MAXILLARY FRACTURE  . TESTICLE SURGERY     as an infant        Home Medications    Prior to Admission medications     Medication Sig Start Date End Date Taking? Authorizing Provider  acetaminophen (TYLENOL) 500 MG tablet Take 500 mg by mouth every 6 (six) hours as needed for mild pain.    [provider]  ibuprofen (ADVIL,MOTRIN) 200 MG tablet Take 200 mg by mouth every 6 (six) hours as needed for headache.    [provider]    Family History No family history on file.  Social History Social History   Tobacco Use  . Smoking status: Never Smoker  . Smokeless tobacco: Never Used  Substance Use Topics  . Alcohol use: Yes  . Drug use: No     Allergies   Patient has no known allergies.   Review of Systems Review of Systems  Constitutional: Negative for chills and fever.  Respiratory: Negative.  Negative for cough and shortness of breath.   Cardiovascular: Negative.  Negative for chest pain.  Gastrointestinal: Positive for abdominal pain. Negative for nausea and vomiting.  Genitourinary: Negative for flank pain and hematuria.  Musculoskeletal: Negative.  Negative for back pain.  Skin: Negative.   Neurological: Negative.      Physical Exam Updated Vital Signs BP 119/85   Pulse 65   Temp 98.3 F (36.8 C) (Oral)   Resp 17   Ht 5\' 11"  (1.803 m)   Wt 122.5 kg   SpO2 99%   BMI 37.66 kg/m   Physical Exam  Constitutional: He appears well-developed and well-nourished.  Patient very  comfortable appearing  HENT:  Head: Normocephalic.  Neck: Normal range of motion. Neck supple.  Cardiovascular: Normal rate and regular rhythm.  Pulmonary/Chest: Effort normal and breath sounds normal.  Abdominal: Soft. Bowel sounds are normal. There is tenderness (Very minimal LLQ tenderness. ) in the left lower quadrant. There is no rebound and no guarding.  Genitourinary:  Genitourinary Comments: No left CVA tenderness.  Musculoskeletal: Normal range of motion.  Neurological: He is alert. No cranial nerve deficit.  Skin: Skin is warm and dry. No rash noted.  Psychiatric: He has a  normal mood and affect.  Nursing note and vitals reviewed.    ED Treatments / Results  Labs (all labs ordered are listed, but only abnormal results are displayed) Labs Reviewed  COMPREHENSIVE METABOLIC PANEL - Abnormal; Notable for the following components:      Result Value   Glucose, Bld 113 (*)    BUN 21 (*)    Creatinine, Ser 1.29 (*)    ALT 74 (*)    All other components within normal limits  CBC - Abnormal; Notable for the following components:   Platelets 549 (*)    All other components within normal limits  URINALYSIS, ROUTINE W REFLEX MICROSCOPIC - Abnormal; Notable for the following components:   Hgb urine dipstick MODERATE (*)    All other components within normal limits  LIPASE, BLOOD    EKG None  Radiology No results found.  Procedures Procedures (including critical care time)  Medications Ordered in ED Medications  fentaNYL (SUBLIMAZE) injection 50 mcg (50 mcg Intravenous Given 04/23/18 2050)     Initial Impression / Assessment and Plan / ED Course  I have reviewed the triage vital signs and the nursing notes.  Pertinent labs & imaging results that were available during my care of the patient were reviewed by me and considered in my medical decision making (see chart for details).     Patient here with nonradiating, constant, progressively worsening LLQ abdominal pain since early 04/23/18. No fever, N, V.   DDx - diverticulitis vs kindey stone. He does have small hematuria but denies this feels like pain he knows a kidney stone pain. Will obtain CT w/o CM to evaluate further.   3mm mid-ureter stone on left which accounts for patient's presenting symptoms. Also shows diverticula without inflammation. Incidentally shows "minimal patchy ground-glass opacity in the lingula, infectious vs inflammatory:" Given recent history of Legionnaires, this was discussed with the patient. He has no fever, cough, SOB, or leukocytosis. He feels his previous symptoms  related to the diagnosis have completely resolved. Doubt recurrent or persistent infection. Encouraged PCP follow up as recommended at the time of discharge from treating hospital in [place].    Final Clinical Impressions(s) / ED Diagnoses   Final diagnoses:  None   1. Left ureterolithiasis   ED Discharge Orders    None       Danne Harbor 04/24/18 0103    Palumbo, April, MD 04/24/18 0104

## 2019-12-11 IMAGING — CT CT ABD-PELV W/O CM
2 of 4 series · 16 of 46 positions shown, 18 images · non-contrast
Comparison: CT 09/14/2009

CLINICAL DATA: Left lower quadrant pain for 1 day. Diverticulitis
versus kidney stone.

EXAM:
CT ABDOMEN AND PELVIS WITHOUT CONTRAST
TECHNIQUE: Multidetector CT imaging of the abdomen and pelvis was performed
following the standard protocol without IV contrast.

[Series 3: a/p w/o 5mm · axial · non-contrast · 0.88mm/px · z∈[+735,+1200]mm · 13 of 103 slices shown, 15 images]
[im 5/103  soft-tissue]
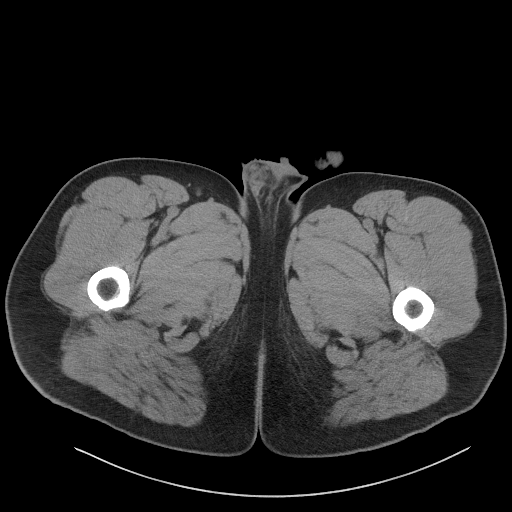
[im 5/103  bone]
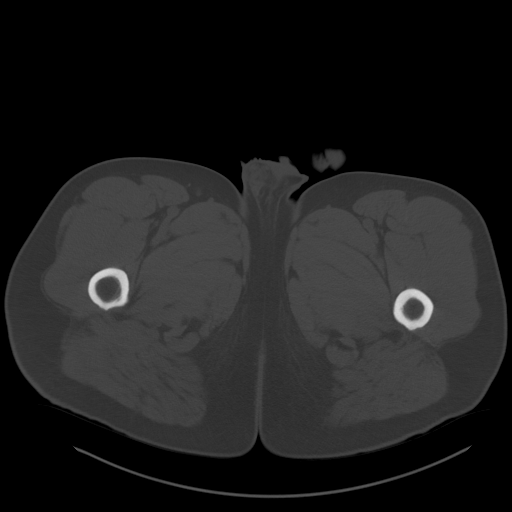
[im 13/103  soft-tissue]
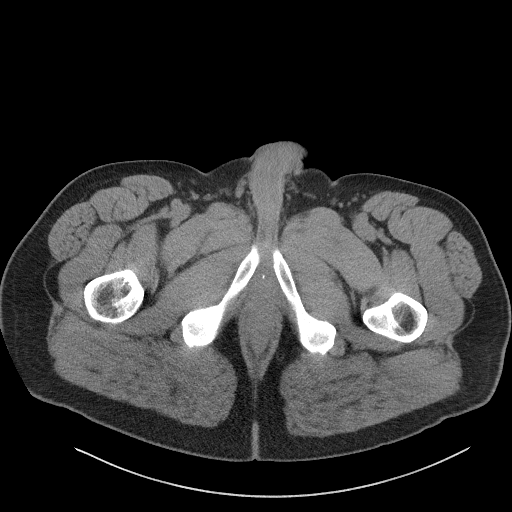
[im 22/103  soft-tissue]
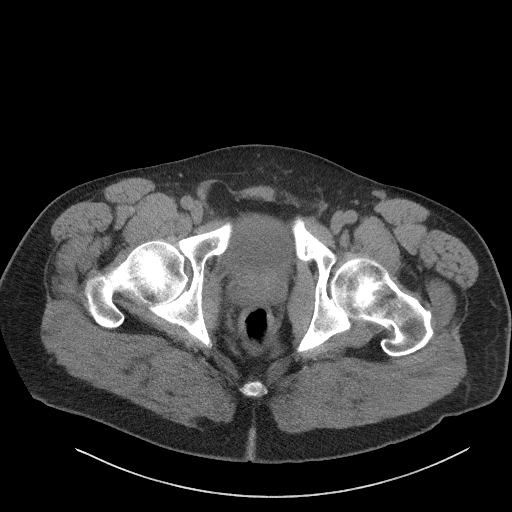
[im 30/103  soft-tissue]
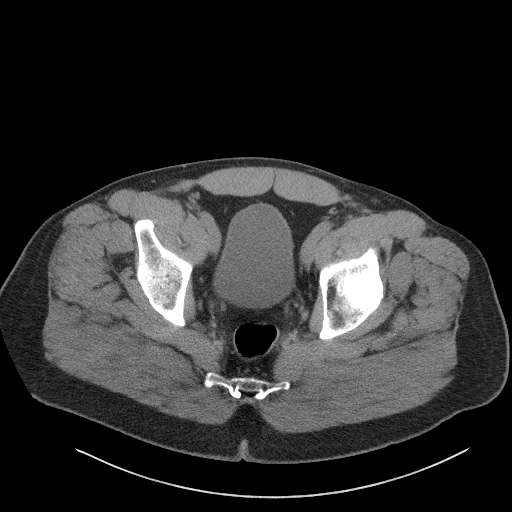
[im 35/103  soft-tissue]
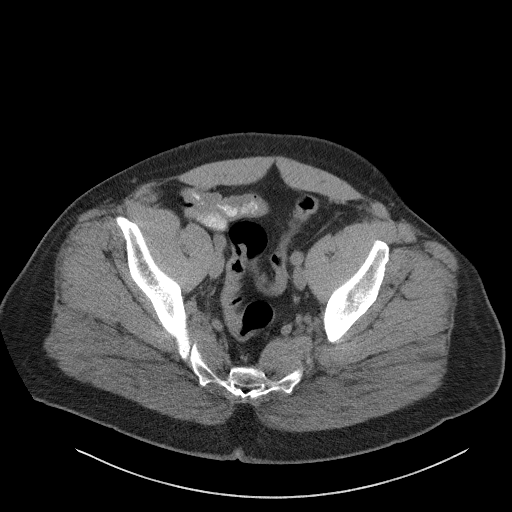
[im 43/103  soft-tissue]
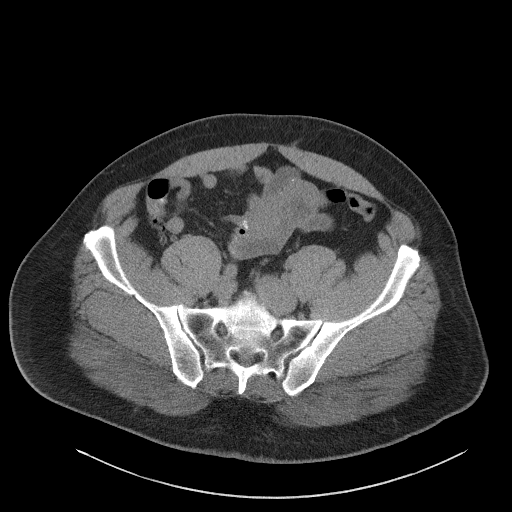
[im 52/103  soft-tissue]
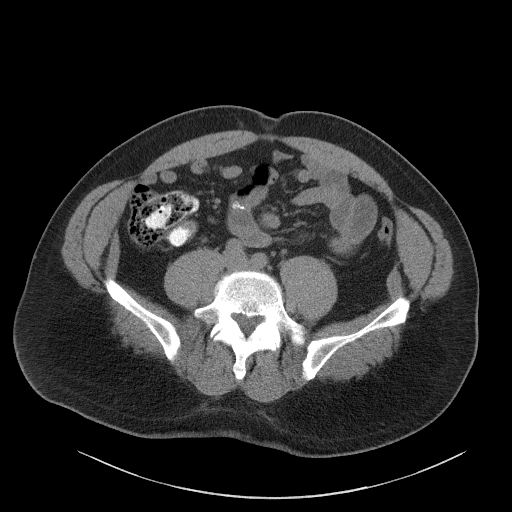
[im 60/103  soft-tissue]
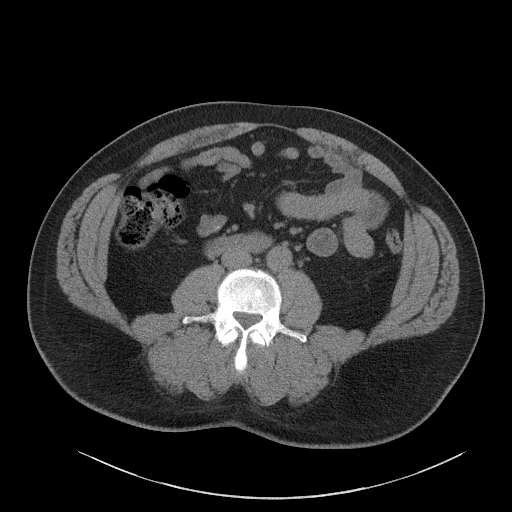
[im 69/103  soft-tissue]
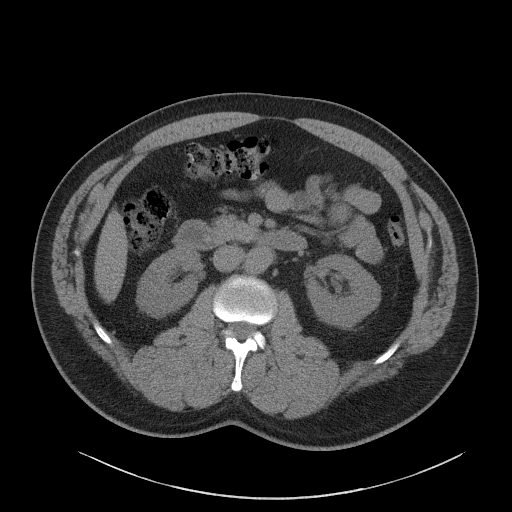
[im 69/103  bone]
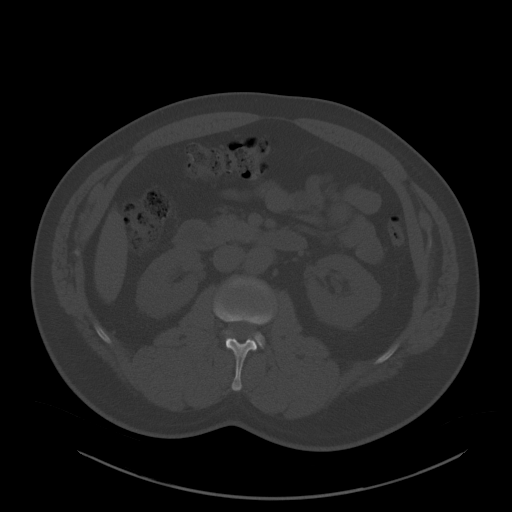
[im 73/103  soft-tissue]
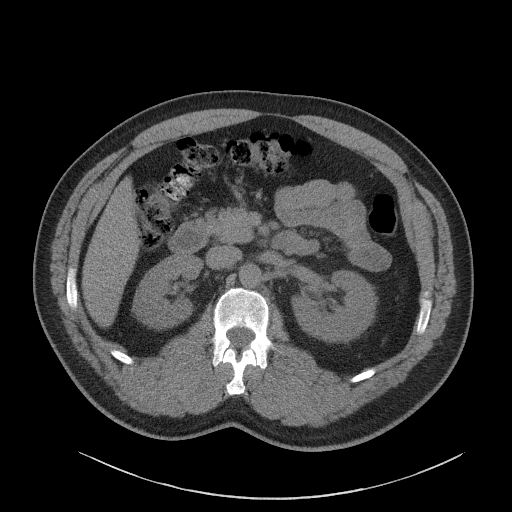
[im 81/103  soft-tissue]
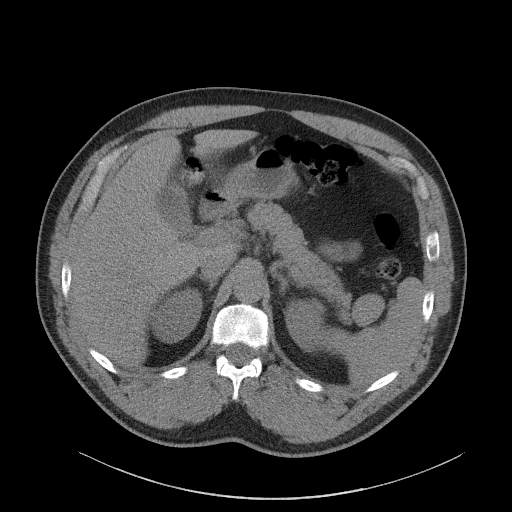
[im 90/103  soft-tissue]
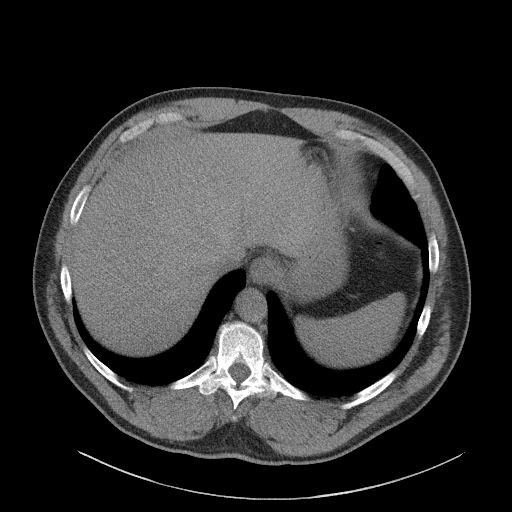
[im 98/103  soft-tissue]
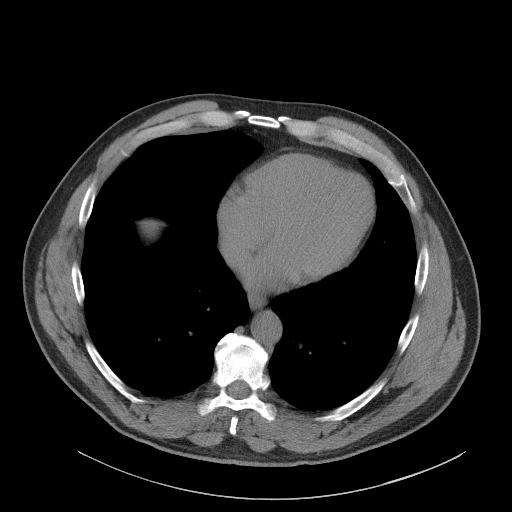

[Series 6: a/p w/o cor · coronal · non-contrast · 0.92mm/px · 3 of 151 slices shown]
[im 51/151  soft-tissue]
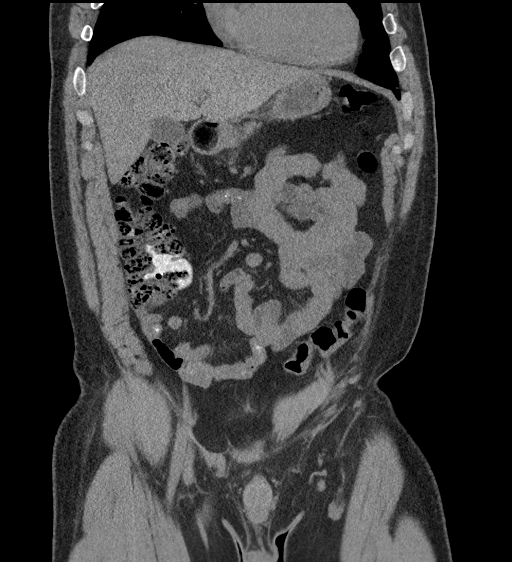
[im 67/151  soft-tissue]
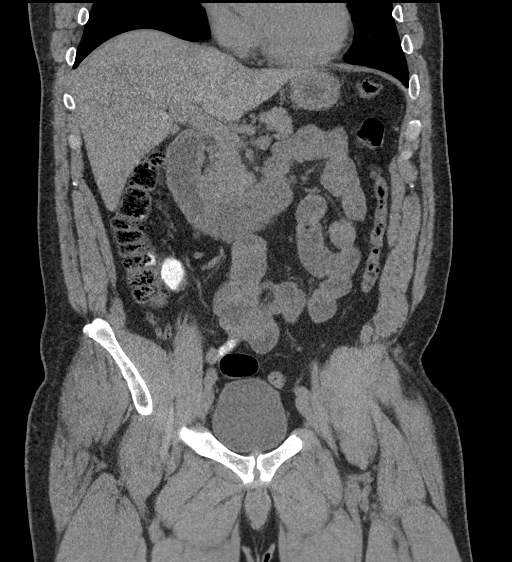
[im 84/151  soft-tissue]
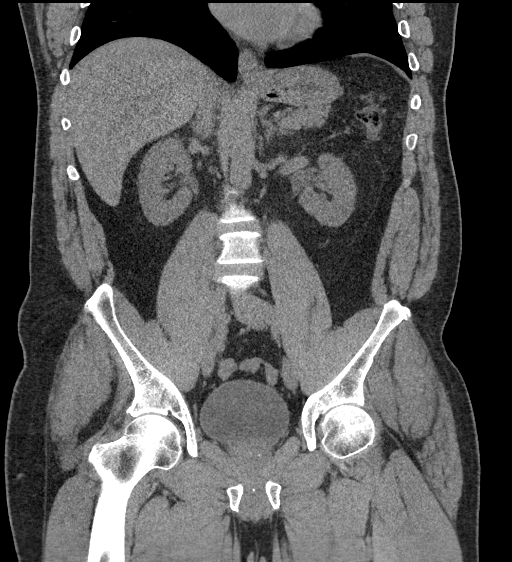

[16 of 46 positions shown; findings below may reference images not displayed]

FINDINGS: Lower chest: Minimal patchy ground-glass opacities in the lingula,
partially included. No pleural fluid.

Hepatobiliary: No focal liver abnormality is seen. No gallstones,
gallbladder wall thickening, or biliary dilatation.

Pancreas: No ductal dilatation or inflammation.

Spleen: Normal in size without focal abnormality. Splenule at the
inferior hilum.

Adrenals/Urinary Tract: Normal adrenal glands. Partially 3 mm
obstructing stone in the left mid ureter with mild
hydroureteronephrosis and perinephric edema. More distal ureter is
decompressed. There is a punctate nonobstructing stone in the upper
left kidney. No right renal stones or hydronephrosis. The right
ureter is decompressed. The urinary bladder is partially distended
without wall thickening. No bladder stone.

Stomach/Bowel: Stomach partially distended. No small bowel wall
thickening, inflammatory change or obstruction. High-density
material in the distal small bowel and colon consistent with
ingested material or contrast. Normal appendix. Moderate stool
burden in the colon. Minimal sigmoid diverticulosis without
diverticulitis.

Vascular/Lymphatic: No aneurysm. No enlarged abdominal or pelvic
lymph nodes.

Reproductive: Prostate is unremarkable.

Other: No free air, free fluid, or intra-abdominal fluid collection.
Tiny fat containing umbilical hernia.

Musculoskeletal: There are no acute or suspicious osseous
abnormalities.
IMPRESSION: 1. Partially obstructing 3 mm stone in the left mid ureter with mild
hydronephrosis and perinephric edema.
2. Punctate nonobstructing stone in the upper left kidney.
3. Minimal sigmoid diverticulosis without diverticulitis.
4. Minimal patchy ground-glass opacity in the lingula is
nonspecific, likely infectious or inflammatory.
